# Patient Record
Sex: Female | Born: 1960
Health system: Southern US, Community
[De-identification: ages and names within clinical notes are randomized; demographics above are authoritative.]

## PROBLEM LIST (undated history)

## (undated) DIAGNOSIS — E039 Hypothyroidism, unspecified: Secondary | ICD-10-CM

## (undated) DIAGNOSIS — N63 Unspecified lump in unspecified breast: Secondary | ICD-10-CM

## (undated) DIAGNOSIS — C50411 Malignant neoplasm of upper-outer quadrant of right female breast: Secondary | ICD-10-CM

## (undated) DIAGNOSIS — F32A Depression, unspecified: Secondary | ICD-10-CM

## (undated) DIAGNOSIS — Z170 Estrogen receptor positive status [ER+]: Principal | ICD-10-CM

## (undated) DIAGNOSIS — F419 Anxiety disorder, unspecified: Secondary | ICD-10-CM

## (undated) DIAGNOSIS — F329 Major depressive disorder, single episode, unspecified: Secondary | ICD-10-CM

## (undated) HISTORY — DX: Malignant neoplasm of upper-outer quadrant of right female breast: C50.411

## (undated) HISTORY — DX: Estrogen receptor positive status (ER+): Z17.0

## (undated) HISTORY — DX: Anxiety disorder, unspecified: F41.9

## (undated) HISTORY — PX: BREAST SURGERY: SHX581

## (undated) HISTORY — DX: Depression, unspecified: F32.A

## (undated) HISTORY — PX: TONSILLECTOMY: SUR1361

## (undated) HISTORY — DX: Major depressive disorder, single episode, unspecified: F32.9

---

## 1999-04-24 ENCOUNTER — Other Ambulatory Visit: Admission: RE | Admit: 1999-04-24 | Discharge: 1999-04-24 | Payer: Self-pay | Admitting: *Deleted

## 2000-03-28 ENCOUNTER — Other Ambulatory Visit: Admission: RE | Admit: 2000-03-28 | Discharge: 2000-03-28 | Payer: Self-pay | Admitting: *Deleted

## 2002-02-22 ENCOUNTER — Other Ambulatory Visit: Admission: RE | Admit: 2002-02-22 | Discharge: 2002-02-22 | Payer: Self-pay | Admitting: Obstetrics and Gynecology

## 2003-03-19 ENCOUNTER — Other Ambulatory Visit: Admission: RE | Admit: 2003-03-19 | Discharge: 2003-03-19 | Payer: Self-pay | Admitting: Obstetrics and Gynecology

## 2003-07-18 ENCOUNTER — Ambulatory Visit (HOSPITAL_COMMUNITY): Admission: RE | Admit: 2003-07-18 | Discharge: 2003-07-18 | Payer: Self-pay | Admitting: Gastroenterology

## 2003-07-29 ENCOUNTER — Ambulatory Visit (HOSPITAL_COMMUNITY): Admission: RE | Admit: 2003-07-29 | Discharge: 2003-07-29 | Payer: Self-pay | Admitting: Gastroenterology

## 2010-10-12 ENCOUNTER — Other Ambulatory Visit (HOSPITAL_COMMUNITY)
Admission: RE | Admit: 2010-10-12 | Discharge: 2010-10-12 | Disposition: A | Discharge status: Home / Self Care | Payer: 59 | Source: Ambulatory Visit | Attending: Family Medicine | Admitting: Family Medicine

## 2010-10-12 ENCOUNTER — Other Ambulatory Visit: Payer: Self-pay | Admitting: Family Medicine

## 2010-10-12 DIAGNOSIS — Z124 Encounter for screening for malignant neoplasm of cervix: Secondary | ICD-10-CM | POA: Insufficient documentation

## 2011-10-20 ENCOUNTER — Other Ambulatory Visit (HOSPITAL_COMMUNITY)
Admission: RE | Admit: 2011-10-20 | Discharge: 2011-10-20 | Disposition: A | Discharge status: Home / Self Care | Payer: 59 | Source: Ambulatory Visit | Attending: Family Medicine | Admitting: Family Medicine

## 2011-10-20 ENCOUNTER — Other Ambulatory Visit: Payer: Self-pay | Admitting: Family Medicine

## 2011-10-20 DIAGNOSIS — Z124 Encounter for screening for malignant neoplasm of cervix: Secondary | ICD-10-CM | POA: Insufficient documentation

## 2012-09-28 ENCOUNTER — Other Ambulatory Visit (HOSPITAL_COMMUNITY)
Admission: RE | Admit: 2012-09-28 | Discharge: 2012-09-28 | Disposition: A | Discharge status: Home / Self Care | Payer: BC Managed Care – PPO | Source: Ambulatory Visit | Attending: Family Medicine | Admitting: Family Medicine

## 2012-09-28 ENCOUNTER — Other Ambulatory Visit: Payer: Self-pay | Admitting: Family Medicine

## 2012-09-28 DIAGNOSIS — Z124 Encounter for screening for malignant neoplasm of cervix: Secondary | ICD-10-CM | POA: Insufficient documentation

## 2014-12-05 ENCOUNTER — Other Ambulatory Visit: Payer: Self-pay

## 2014-12-05 DIAGNOSIS — Z1231 Encounter for screening mammogram for malignant neoplasm of breast: Secondary | ICD-10-CM

## 2015-01-08 ENCOUNTER — Ambulatory Visit
Admission: RE | Admit: 2015-01-08 | Discharge: 2015-01-08 | Disposition: A | Discharge status: Home / Self Care | Payer: BLUE CROSS/BLUE SHIELD | Source: Ambulatory Visit

## 2015-01-08 DIAGNOSIS — Z1231 Encounter for screening mammogram for malignant neoplasm of breast: Secondary | ICD-10-CM

## 2015-02-27 ENCOUNTER — Other Ambulatory Visit: Payer: Self-pay | Admitting: Family Medicine

## 2015-02-27 ENCOUNTER — Other Ambulatory Visit (HOSPITAL_COMMUNITY)
Admission: RE | Admit: 2015-02-27 | Discharge: 2015-02-27 | Disposition: A | Discharge status: Home / Self Care | Payer: BLUE CROSS/BLUE SHIELD | Source: Ambulatory Visit | Attending: Family Medicine | Admitting: Family Medicine

## 2015-02-27 DIAGNOSIS — Z01411 Encounter for gynecological examination (general) (routine) with abnormal findings: Secondary | ICD-10-CM | POA: Diagnosis not present

## 2015-03-04 LAB — CYTOLOGY - PAP

## 2015-10-29 DIAGNOSIS — F419 Anxiety disorder, unspecified: Secondary | ICD-10-CM | POA: Diagnosis not present

## 2015-10-29 DIAGNOSIS — E7800 Pure hypercholesterolemia, unspecified: Secondary | ICD-10-CM | POA: Diagnosis not present

## 2015-10-29 DIAGNOSIS — E039 Hypothyroidism, unspecified: Secondary | ICD-10-CM | POA: Diagnosis not present

## 2015-10-29 DIAGNOSIS — Z309 Encounter for contraceptive management, unspecified: Secondary | ICD-10-CM | POA: Diagnosis not present

## 2016-03-17 ENCOUNTER — Other Ambulatory Visit: Payer: Self-pay | Admitting: Family Medicine

## 2016-03-17 DIAGNOSIS — Z0000 Encounter for general adult medical examination without abnormal findings: Secondary | ICD-10-CM | POA: Diagnosis not present

## 2016-03-17 DIAGNOSIS — N6310 Unspecified lump in the right breast, unspecified quadrant: Secondary | ICD-10-CM

## 2016-03-17 DIAGNOSIS — E039 Hypothyroidism, unspecified: Secondary | ICD-10-CM | POA: Diagnosis not present

## 2016-03-17 DIAGNOSIS — N63 Unspecified lump in unspecified breast: Secondary | ICD-10-CM | POA: Diagnosis not present

## 2016-03-17 DIAGNOSIS — E7800 Pure hypercholesterolemia, unspecified: Secondary | ICD-10-CM | POA: Diagnosis not present

## 2016-03-19 ENCOUNTER — Ambulatory Visit
Admission: RE | Admit: 2016-03-19 | Discharge: 2016-03-19 | Disposition: A | Discharge status: Home / Self Care | Payer: BLUE CROSS/BLUE SHIELD | Source: Ambulatory Visit | Attending: Family Medicine | Admitting: Family Medicine

## 2016-03-19 ENCOUNTER — Other Ambulatory Visit: Payer: Self-pay | Admitting: Family Medicine

## 2016-03-19 DIAGNOSIS — N6310 Unspecified lump in the right breast, unspecified quadrant: Secondary | ICD-10-CM

## 2016-03-19 DIAGNOSIS — N63 Unspecified lump in unspecified breast: Secondary | ICD-10-CM

## 2016-03-19 DIAGNOSIS — C50411 Malignant neoplasm of upper-outer quadrant of right female breast: Secondary | ICD-10-CM | POA: Diagnosis not present

## 2016-03-19 DIAGNOSIS — N6311 Unspecified lump in the right breast, upper outer quadrant: Secondary | ICD-10-CM | POA: Diagnosis not present

## 2016-03-19 HISTORY — DX: Unspecified lump in unspecified breast: N63.0

## 2016-03-23 ENCOUNTER — Telehealth: Payer: Self-pay | Admitting: *Deleted

## 2016-03-23 NOTE — Telephone Encounter (Signed)
Left message for return phone call to schedule for [facility] 03/31/16.

## 2016-03-24 ENCOUNTER — Telehealth: Payer: Self-pay | Admitting: *Deleted

## 2016-03-24 ENCOUNTER — Encounter: Payer: Self-pay | Admitting: *Deleted

## 2016-03-24 DIAGNOSIS — Z170 Estrogen receptor positive status [ER+]: Secondary | ICD-10-CM

## 2016-03-24 DIAGNOSIS — C50411 Malignant neoplasm of upper-outer quadrant of right female breast: Secondary | ICD-10-CM

## 2016-03-24 HISTORY — DX: Estrogen receptor positive status (ER+): Z17.0

## 2016-03-24 HISTORY — DX: Malignant neoplasm of upper-outer quadrant of right female breast: C50.411

## 2016-03-24 NOTE — Telephone Encounter (Signed)
Confirmed [facility] for 03/31/16 at 0815.  Instructions and contact information given.

## 2016-03-29 DIAGNOSIS — Z0100 Encounter for examination of eyes and vision without abnormal findings: Secondary | ICD-10-CM | POA: Diagnosis not present

## 2016-03-31 ENCOUNTER — Ambulatory Visit (HOSPITAL_BASED_OUTPATIENT_CLINIC_OR_DEPARTMENT_OTHER): Payer: BLUE CROSS/BLUE SHIELD | Admitting: Hematology and Oncology

## 2016-03-31 ENCOUNTER — Ambulatory Visit
Admission: RE | Admit: 2016-03-31 | Discharge: 2016-03-31 | Disposition: A | Discharge status: Home / Self Care | Payer: BLUE CROSS/BLUE SHIELD | Source: Ambulatory Visit | Attending: Radiation Oncology | Admitting: Radiation Oncology

## 2016-03-31 ENCOUNTER — Encounter: Payer: Self-pay | Admitting: Hematology and Oncology

## 2016-03-31 ENCOUNTER — Ambulatory Visit: Payer: BLUE CROSS/BLUE SHIELD | Admitting: Physical Therapy

## 2016-03-31 ENCOUNTER — Other Ambulatory Visit (HOSPITAL_BASED_OUTPATIENT_CLINIC_OR_DEPARTMENT_OTHER): Payer: BLUE CROSS/BLUE SHIELD

## 2016-03-31 ENCOUNTER — Other Ambulatory Visit: Payer: Self-pay | Admitting: Surgery

## 2016-03-31 VITALS — BP 172/89 | HR 94 | Temp 98.4°F | Resp 18 | Ht 62.5 in | Wt 149.8 lb

## 2016-03-31 DIAGNOSIS — C50911 Malignant neoplasm of unspecified site of right female breast: Secondary | ICD-10-CM | POA: Diagnosis not present

## 2016-03-31 DIAGNOSIS — F419 Anxiety disorder, unspecified: Secondary | ICD-10-CM | POA: Diagnosis not present

## 2016-03-31 DIAGNOSIS — C50411 Malignant neoplasm of upper-outer quadrant of right female breast: Secondary | ICD-10-CM

## 2016-03-31 DIAGNOSIS — Z170 Estrogen receptor positive status [ER+]: Secondary | ICD-10-CM

## 2016-03-31 LAB — COMPREHENSIVE METABOLIC PANEL
ALT: 24 U/L (ref 0–55)
ANION GAP: 9 meq/L (ref 3–11)
AST: 17 U/L (ref 5–34)
Albumin: 3.6 g/dL (ref 3.5–5.0)
Alkaline Phosphatase: 95 U/L (ref 40–150)
BUN: 13.1 mg/dL (ref 7.0–26.0)
CHLORIDE: 106 meq/L (ref 98–109)
CO2: 23 meq/L (ref 22–29)
CREATININE: 0.8 mg/dL (ref 0.6–1.1)
Calcium: 9.4 mg/dL (ref 8.4–10.4)
EGFR: 84 mL/min/{1.73_m2} — ABNORMAL LOW (ref >90)
Glucose: 111 mg/dl (ref 70–140)
POTASSIUM: 3.8 meq/L (ref 3.5–5.1)
Sodium: 138 mEq/L (ref 136–145)
Total Bilirubin: 0.4 mg/dL (ref 0.20–1.20)
Total Protein: 7.9 g/dL (ref 6.4–8.3)

## 2016-03-31 LAB — CBC WITH DIFFERENTIAL/PLATELET
BASO%: 0.3 % (ref 0.0–2.0)
BASOS ABS: 0 10*3/uL (ref 0.0–0.1)
EOS%: 3.2 % (ref 0.0–7.0)
Eosinophils Absolute: 0.2 10*3/uL (ref 0.0–0.5)
HCT: 40 % (ref 34.8–46.6)
HGB: 12.6 g/dL (ref 11.6–15.9)
LYMPH%: 23.8 % (ref 14.0–49.7)
MCH: 26 pg (ref 25.1–34.0)
MCHC: 31.5 g/dL (ref 31.5–36.0)
MCV: 82.5 fL (ref 79.5–101.0)
MONO#: 0.4 10*3/uL (ref 0.1–0.9)
MONO%: 5 % (ref 0.0–14.0)
NEUT#: 4.7 10*3/uL (ref 1.5–6.5)
NEUT%: 67.7 % (ref 38.4–76.8)
PLATELETS: 354 10*3/uL (ref 145–400)
RBC: 4.85 10*6/uL (ref 3.70–5.45)
RDW: 14.2 % (ref 11.2–14.5)
WBC: 6.9 10*3/uL (ref 3.9–10.3)
lymph#: 1.7 10*3/uL (ref 0.9–3.3)

## 2016-03-31 MED ORDER — LIDOCAINE-PRILOCAINE 2.5-2.5 % EX CREA: TOPICAL_CREAM | CUTANEOUS | 3 refills | Status: DC

## 2016-03-31 MED ORDER — ONDANSETRON HCL 8 MG PO TABS: 8.0000 mg | ORAL_TABLET | Freq: Two times a day (BID) | ORAL | 1 refills | Status: DC | PRN

## 2016-03-31 MED ORDER — PROCHLORPERAZINE MALEATE 10 MG PO TABS: 10.0000 mg | ORAL_TABLET | Freq: Four times a day (QID) | ORAL | 1 refills | Status: DC | PRN

## 2016-03-31 MED ORDER — ALPRAZOLAM 0.5 MG PO TABS: 0.5000 mg | ORAL_TABLET | Freq: Three times a day (TID) | ORAL | 3 refills | Status: AC | PRN

## 2016-03-31 MED ORDER — DEXAMETHASONE 4 MG PO TABS: 4.0000 mg | ORAL_TABLET | Freq: Every day | ORAL | 1 refills | Status: DC

## 2016-03-31 NOTE — Progress Notes (Signed)
Radiation Oncology         [phone] ________________________________  Initial Outpatient Consultation  Name: Sara Hooper MRN: [id number]  Date: 03/31/2016  DOB: 12-10-1960  LZ:[id number],LPFXT RUTH, MD  Rozetta Nunnery, *   REFERRING PHYSICIAN: Alphonsa Overall  DIAGNOSIS: Clinical stage IA (cT1cN0) high grade invasive ductal carcinoma of the right breast (triple positive)  HISTORY OF PRESENT ILLNESS::Sara Hooper is a 56 y.o. female who self palpated a right breast mass in the 12:00 position confirmed by her primary care physician. Diagnostic bilateral mammogram on 03/19/16 showed an obscured irregular mass in the 12:00 position in the right breast middle to posterior depth with faint associated calcifications. On physical exam, an irregular mass in the superior right breast was palpated and measured approximately 3.5 cm. Ultrasound of the right breast showed a 2.0 x 1.9 x 3.0 cm mass in the 11:30 position 4 cm from the nipple. There was no right axillary adenopathy and the right breast was negative for disease. Biopsy of the 11:30 postion of the right breast on 03/19/16 revealed high grade IDC (ER 90% positive, PR 60% positive, HER2 positive, Ki67 70%). The patient presents today in multidisciplinary breast clinic to discuss treatment options for the management of her disease.  Gynecologic History  Age at first menstrual period? 16  Are you still having periods? Yes Approximate date of last period? 03/09/16  If you are still having periods: Are your periods regular? No  If you no longer have periods: Have you used hormone replacement? No Obstetric History:  How many children have you carried to term? N/A  Have you used birth control pills or hormone shots for contraception? Yes  If so, for how long (or approximate dates)? 10 years Health Maintenance:  Have you ever had a colonoscopy? No  Have you ever had a bone density? No  Date of your last PAP smear? 01/2015 Date of your FIRST  mammogram? 01/2015  PREVIOUS RADIATION THERAPY: No  PAST MEDICAL HISTORY:  has a past medical history of Breast mass (03/19/2016) and Malignant neoplasm of upper-outer quadrant of right breast in female, estrogen receptor positive ([place]) (03/24/2016).    PAST SURGICAL HISTORY:No past surgical history on file.  FAMILY HISTORY: family history is not on file.  SOCIAL HISTORY:    ALLERGIES: Patient has no allergy information on record.  MEDICATIONS:  No current outpatient prescriptions on file.   No current facility-administered medications for this encounter.     REVIEW OF SYSTEMS: On review of systems the patient reports a right breast lump, anxiety, and depression. The patient denies issues with her general constitution, eyes, ENT, heart, lungs, bowels, urine, skin, muscle or bones, nervous system, hormones, blood/lymph systems, or immune system. Pertinent items noted in HPI and remainder of comprehensive ROS otherwise negative.   PHYSICAL EXAM:  Vitals with BMI 03/31/2016  Height 5' 2.5"  Weight 149 lbs 13 oz  BMI 27  Systolic 024  Diastolic 89  Pulse 94  Respirations 18  General: Alert and oriented, in no acute distress HEENT: Head is normocephalic. Extraocular movements are intact. Oropharynx is clear. Neck: Neck is supple, no palpable cervical or supraclavicular lymphadenopathy. Heart: Regular in rate and rhythm with no murmurs, rubs, or gallops. Chest: Clear to auscultation bilaterally, with no rhonchi, wheezes, or rales. Extremities: No cyanosis or edema. Lymphatics: see Neck Exam. Axillary exam was negative. Skin: No concerning lesions. Musculoskeletal: symmetric strength and muscle tone throughout. Neurologic: Cranial nerves II through XII are grossly intact. No obvious focalities.  Speech is fluent. Coordination is intact. Psychiatric: Judgment and insight are intact. Affect is appropriate. Breast: Left breast no palpable mass or nipple discharge. In the right breast  approximately 11:00 position a 3.5 x 4.0 cm mass was appreciated, no nipple discharge or bleeding.  ECOG = 1   LABORATORY DATA:  No results found for: WBC, HGB, HCT, MCV, PLT, NEUTROABS No results found for: NA, K, CL, CO2, GLUCOSE, CREATININE, CALCIUM    RADIOGRAPHY: US Breast Ltd Uni Right Inc Axilla  Result Date: 03/19/2016 CLINICAL DATA:  Right breast 12 o'clock palpable mass felt clinically. EXAM: 2D DIGITAL DIAGNOSTIC BILATERAL MAMMOGRAM WITH CAD AND ADJUNCT TOMO ULTRASOUND RIGHT BREAST COMPARISON:  Previous exam(s). ACR Breast Density Category c: The breast tissue is heterogeneously dense, which may obscure small masses. FINDINGS: Mammographically, there are no suspicious masses, areas of architectural distortion or microcalcifications in the left breast. There is an ill-defined mostly obscured irregular mass in the right 12 o'clock breast, middle to posterior depth. Faint associated calcifications also seen. This finding corresponds to the area of palpable concern. Mammographic images were processed with CAD. On physical exam, I could palpate an irregular mass in the right superior breast which by palpation measures approximately 3.5 cm. Targeted ultrasound is performed, showing right breast 11:30 o'clock 4 cm from the nipple hypoechoic irregular mass which measures 2.0 x 1.9 x 3.0 cm. No evidence of right axillary lymphadenopathy. IMPRESSION: Suspicious palpable right breast 11:30 o'clock mass, for which ultrasound-guided core needle biopsy is recommended. No evidence of right axillary lymphadenopathy. RECOMMENDATION: Ultrasound-guided core needle biopsy of the right breast mass. I have discussed the findings and recommendations with the patient. Results were also provided in writing at the conclusion of the visit. If applicable, a reminder letter will be sent to the patient regarding the next appointment. BI-RADS CATEGORY  4: Suspicious. Electronically Signed   By: Fidela Salisbury M.D.    On: 03/19/2016 09:42   Mm Diag Breast Tomo Bilateral  Result Date: 03/19/2016 CLINICAL DATA:  Right breast 12 o'clock palpable mass felt clinically. EXAM: 2D DIGITAL DIAGNOSTIC BILATERAL MAMMOGRAM WITH CAD AND ADJUNCT TOMO ULTRASOUND RIGHT BREAST COMPARISON:  Previous exam(s). ACR Breast Density Category c: The breast tissue is heterogeneously dense, which may obscure small masses. FINDINGS: Mammographically, there are no suspicious masses, areas of architectural distortion or microcalcifications in the left breast. There is an ill-defined mostly obscured irregular mass in the right 12 o'clock breast, middle to posterior depth. Faint associated calcifications also seen. This finding corresponds to the area of palpable concern. Mammographic images were processed with CAD. On physical exam, I could palpate an irregular mass in the right superior breast which by palpation measures approximately 3.5 cm. Targeted ultrasound is performed, showing right breast 11:30 o'clock 4 cm from the nipple hypoechoic irregular mass which measures 2.0 x 1.9 x 3.0 cm. No evidence of right axillary lymphadenopathy. IMPRESSION: Suspicious palpable right breast 11:30 o'clock mass, for which ultrasound-guided core needle biopsy is recommended. No evidence of right axillary lymphadenopathy. RECOMMENDATION: Ultrasound-guided core needle biopsy of the right breast mass. I have discussed the findings and recommendations with the patient. Results were also provided in writing at the conclusion of the visit. If applicable, a reminder letter will be sent to the patient regarding the next appointment. BI-RADS CATEGORY  4: Suspicious. Electronically Signed   By: Fidela Salisbury M.D.   On: 03/19/2016 09:42   Mm Clip Placement Right  Result Date: 03/19/2016 CLINICAL DATA:  Post ultrasound-guided core needle biopsy  of right breast 12/1928 o'clock mass. EXAM: DIAGNOSTIC RIGHT MAMMOGRAM POST ULTRASOUND BIOPSY COMPARISON:  Previous exam(s).  FINDINGS: Mammographic images were obtained following ultrasound guided biopsy of right breast mass. Two-view mammography demonstrates presence of ribbon shaped marker within the biopsy site in appropriate radiographic position. IMPRESSION: Successful placement of ribbon shaped marker, post ultrasound-guided core needle biopsy of the right breast. Final Assessment: Post Procedure Mammograms for Marker Placement Electronically Signed   By: Fidela Salisbury M.D.   On: 03/19/2016 14:26   [place] Rt Breast Bx W Loc Dev 1st Lesion Img Bx Spec US Guide  Addendum Date: 03/23/2016   ADDENDUM REPORT: 03/23/2016 07:30 ADDENDUM: Pathology revealed high grade invasive ductal carcinoma in the right breast. This was found to be concordant by Dr. Fidela Salisbury. Pathology results were discussed with the patient by telephone. The patient reported doing well after the biopsy. Post biopsy instructions and care were reviewed and questions were answered. The patient was encouraged to call [facility] for any additional concerns. The patient was referred to the [facility] at the [facility] on March 31, 2016. Pathology results reported by Susa Raring RN, BSN on 03/23/2016. Electronically Signed   By: Fidela Salisbury M.D.   On: 03/23/2016 07:30   Result Date: 03/23/2016 CLINICAL DATA:  Right breast 11:30 o'clock suspicious mass. EXAM: ULTRASOUND GUIDED RIGHT BREAST CORE NEEDLE BIOPSY COMPARISON:  Previous exam(s). FINDINGS: I met with the patient and we discussed the procedure of ultrasound-guided biopsy, including benefits and alternatives. We discussed the high likelihood of a successful procedure. We discussed the risks of the procedure, including infection, bleeding, tissue injury, clip migration, and inadequate sampling. Informed written consent was given. The usual time-out protocol was performed immediately prior to the procedure. Using  sterile technique and 1% Lidocaine as local anesthetic, under direct ultrasound visualization, a 14 gauge spring-loaded device was used to perform biopsy of right breast mass using a medial approach. At the conclusion of the procedure a ribbon shaped tissue marker clip was deployed into the biopsy cavity. Follow up 2 view mammogram was performed and dictated separately. IMPRESSION: Ultrasound guided biopsy of right breast mass. No apparent complications. Electronically Signed: By: Fidela Salisbury M.D. On: 03/19/2016 14:22      IMPRESSION: Clinical stage IA (cT1cN0) high grade invasive ductal carcinoma of the right breast (triple positive)  The patient would be a good candidate for breast conservation therapy with anticipated tumor shrinkage from her neoadjuvant treatment. Radiation to the right breast would be given post-surgically to further reduce the risk of recurrence.  I spoke to the patient today regarding her diagnosis and options for treatment. We discussed the equivalence in terms of survival and local failure between mastectomy and breast conservation. We discussed the role of radiation in decreasing local failures in patients who undergo lumpectomy. We discussed the process of CT simulation and the placement tattoos. We discussed 4-6 weeks of treatment as an outpatient. We discussed the possibility of asymptomatic lung damage. We discussed the low likelihood of secondary malignancies. We discussed the possible side effects including but not limited to skin redness, fatigue, permanent skin darkening, and breast swelling.   PLAN: The patient will undergo a bilateral breast MRI to observe the extent of disease. She will then have an echocardiogram, chemo class, and placement of a port. The patient will have neoadjuvant chemotherapy to shrink the size of tumor. Surgery will be determined based on the results of neoadjuvant chemotherapy.  If there is good tumor shrinkage, then she would undergo a  right lumpectomy. The patient is interested in this surgical option if possible. Radiation would be given post-surgically and then the patient will be placed on an aromatase inhibitor. The patient will return to see me a few weeks after surgery to further discuss radiation depending on the final pathology results. ------------------------------------------------  Blair Promise, PhD, MD  This document serves as a record of services personally performed by Gery Pray, MD. It was created on his behalf by Darcus Austin, a trained medical scribe. The creation of this record is based on the scribe's personal observations and the provider's statements to them. This document has been checked and approved by the attending provider.

## 2016-03-31 NOTE — Progress Notes (Signed)
START ON PATHWAY REGIMEN - Breast   Docetaxel  + Carboplatin + Trastuzumab + Pertuzumab (TCHP) q21 Days:   A cycle is every 21 days:     Pertuzumab        Dose Mod: None     Pertuzumab        Dose Mod: None     Trastuzumab        Dose Mod: None     Trastuzumab        Dose Mod: None     Carboplatin        Dose Mod: None     Docetaxel        Dose Mod: None  **Always confirm dose/schedule in your pharmacy ordering system**    Trastuzumab (Maintenance - NO Loading Dose):   A cycle is every 21 days:     Trastuzumab        Dose Mod: None  **Always confirm dose/schedule in your pharmacy ordering system**    Patient Characteristics: Neoadjuvant Chemotherapy, HER2/neu Positive, ER Positive AJCC Stage Grouping: IIA Current Disease Status: No Distant Mets or Local Recurrence AJCC M Stage: 0 ER Status: Positive (+) AJCC N Stage: 0 AJCC T Stage: 2 HER2/neu: Positive (+) PR Status: Positive (+)  Intent of Therapy: Curative Intent, Discussed with Patient

## 2016-03-31 NOTE — Progress Notes (Signed)
[place] NOTE  Patient Care Team: Darcus Austin, MD as PCP - General (Family Medicine) Alphonsa Overall, MD as Consulting Physician (General Surgery) Nicholas Lose, MD as Consulting Physician (Hematology and Oncology) Eppie Gibson, MD as Attending Physician (Radiation Oncology)  CHIEF COMPLAINTS/PURPOSE OF CONSULTATION:  Newly diagnosed breast cancer  HISTORY OF PRESENTING ILLNESS:  Sara Hooper 56 y.o. female is here because of recent diagnosis of right breast cancer. Patient's primary care physician palpated her breast and felt a lump. This led to investigation with a mammogram and ultrasound. She was found to have a 3 cm mass in the right breast. This was evaluated and biopsy. Pathology came back as invasive ductal carcinoma high-grade that was ER/PR and HER-2 positive with a Ki-67 of 70%. She was presented this morning at the multidisciplinary tumor board and she is here today to discuss the treatment plan. She is accompanied by her husband. She was extremely anxious.  I reviewed her records extensively and collaborated the history with the patient.  SUMMARY OF ONCOLOGIC HISTORY:   Malignant neoplasm of upper-outer quadrant of right breast in female, estrogen receptor positive ([place])   03/19/2016 Initial Diagnosis    Right breast biopsy 11:30 position: IDC high-grade, ER 90%, PR 60%, HER-2 positive ratio 1.75 average copy #9.9, Ki-67 70%; right breast lump felt by PCP upper central 11:30 position 3 cm axilla negative, T2 N0 stage II a clinical stage      MEDICAL HISTORY:  Past Medical History:  Diagnosis Date  . Anxiety   . Breast mass 03/19/2016   right breast mass 1200  . Depression   . Malignant neoplasm of upper-outer quadrant of right breast in female, estrogen receptor positive ([place]) 03/24/2016    SURGICAL HISTORY: History reviewed. No pertinent surgical history.  SOCIAL HISTORY: Social History   Social History  . Marital status: Single    Spouse  name: N/A  . Number of children: N/A  . Years of education: N/A   Occupational History  . Not on file.   Social History Main Topics  . Smoking status: Never Smoker  . Smokeless tobacco: Not on file  . Alcohol use No  . Drug use: No  . Sexual activity: Not on file   Other Topics Concern  . Not on file   Social History Narrative  . No narrative on file    FAMILY HISTORY: Family History  Problem Relation Age of Onset  . Colon cancer Mother     ALLERGIES:  has No Known Allergies.  MEDICATIONS:  Current Outpatient Prescriptions  Medication Sig Dispense Refill  . Celecoxib (CELEBREX PO) Take 1 tablet by mouth daily.    . Levothyroxine Sodium (SYNTHROID PO) Take 1 tablet by mouth daily.    . norethindrone-ethinyl estradiol (MICROGESTIN,JUNEL,LOESTRIN) 1-20 MG-MCG tablet Take 1 tablet by mouth daily.    Marland Kitchen ALPRAZolam (XANAX) 0.5 MG tablet Take 1 tablet (0.5 mg total) by mouth 3 (three) times daily as needed for anxiety. 90 tablet 3   No current facility-administered medications for this visit.     REVIEW OF SYSTEMS:   Constitutional: Denies fevers, chills or abnormal night sweats Eyes: Denies blurriness of vision, double vision or watery eyes Ears, nose, mouth, throat, and face: Denies mucositis or sore throat Respiratory: Denies cough, dyspnea or wheezes Cardiovascular: Denies palpitation, chest discomfort or lower extremity swelling Gastrointestinal:  Denies nausea, heartburn or change in bowel habits Skin: Denies abnormal skin rashes Lymphatics: Denies new lymphadenopathy or easy bruising Neurological:Denies numbness, tingling  or new weaknesses Behavioral/Psych: Severe anxiety Breast: Palpable lump in the breast All other systems were reviewed with the patient and are negative.  PHYSICAL EXAMINATION: ECOG PERFORMANCE STATUS: 1 - Symptomatic but completely ambulatory  Vitals:   03/31/16 0907  BP: (!) 172/89  Pulse: 94  Resp: 18  Temp: 98.4 F (36.9 C)   Filed  Weights   03/31/16 0907  Weight: 149 lb 12.8 oz (67.9 kg)    GENERAL:alert, no distress and comfortable SKIN: skin color, texture, turgor are normal, no rashes or significant lesions EYES: normal, conjunctiva are pink and non-injected, sclera clear OROPHARYNX:no exudate, no erythema and lips, buccal mucosa, and tongue normal  NECK: supple, thyroid normal size, non-tender, without nodularity LYMPH:  no palpable lymphadenopathy in the cervical, axillary or inguinal LUNGS: clear to auscultation and percussion with normal breathing effort HEART: regular rate & rhythm and no murmurs and no lower extremity edema ABDOMEN:abdomen soft, non-tender and normal bowel sounds Musculoskeletal:no cyanosis of digits and no clubbing  PSYCH: alert & oriented x 3 with fluent speech NEURO: no focal motor/sensory deficits  LABORATORY DATA:  I have reviewed the data as listed Lab Results  Component Value Date   WBC 6.9 03/31/2016   HGB 12.6 03/31/2016   HCT 40.0 03/31/2016   MCV 82.5 03/31/2016   PLT 354 03/31/2016   Lab Results  Component Value Date   NA 138 03/31/2016   K 3.8 03/31/2016   CO2 23 03/31/2016    RADIOGRAPHIC STUDIES: I have personally reviewed the radiological reports and agreed with the findings in the report.  ASSESSMENT AND PLAN:  Malignant neoplasm of upper-outer quadrant of right breast in female, estrogen receptor positive ([place]) Right breast biopsy 11:30 position 03/19/2016: IDC high-grade, ER 90%, PR 60%, HER-2 positive ratio 1.75 average copy #9.9, Ki-67 70%; right breast lump felt by PCP upper central 11:30 position 3 cm axilla negative, T2 N0 stage II a clinical stage  Pathology and radiology counseling: Discussed with the patient, the details of pathology including the type of breast cancer,the clinical staging, the significance of ER, PR and HER-2/neu receptors and the implications for treatment. After reviewing the pathology in detail, we proceeded to discuss the  different treatment options between surgery, radiation, chemotherapy, antiestrogen therapies.  Recommendation based on multidisciplinary tumor board: 1. Neoadjuvant chemotherapy with TCH Perjeta 6 cycles followed by Herceptin and Perjeta maintenance for 1 year 2. Followed by breast conserving surgery if possible with sentinel lymph node study 3. Followed by adjuvant radiation therapy 4. Followed by antiestrogen therapy with letrozole 5-7 years   Chemotherapy Counseling: I discussed the risks and benefits of chemotherapy including the risks of nausea/ vomiting, risk of infection from low WBC count, fatigue due to chemo or anemia, bruising or bleeding due to low platelets, mouth sores, loss/ change in taste and decreased appetite. Liver and kidney function will be monitored through out chemotherapy as abnormalities in liver and kidney function may be a side effect of treatment. Cardiac dysfunction due to Herceptin and Perjeta were discussed in detail. Risk of permanent bone marrow dysfunction due to chemo were also discussed.  Plan: 1. Port placement 2. Echocardiogram 3. Chemotherapy class 4. Breast MRI  Severe anxiety: I gave a prescription for Xanax 0.5 mg by mouth 3 times a day when necessary.  Our goal is to manage patient's toxicities and simultaneously cure her breast cancer.  Patient's husband has been extremely supportive.   Return to clinic in 2 weeks to start chemotherapy.   All  questions were answered. The patient knows to call the clinic with any problems, questions or concerns.    Rulon Eisenmenger, MD 03/31/16

## 2016-03-31 NOTE — Assessment & Plan Note (Signed)
Right breast biopsy 11:30 position 03/19/2016: IDC high-grade, ER 90%, PR 60%, HER-2 positive ratio 1.75 average copy #9.9, Ki-67 70%; right breast lump felt by PCP upper central 11:30 position 3 cm axilla negative, T2 N0 stage II a clinical stage  Pathology and radiology counseling: Discussed with the patient, the details of pathology including the type of breast cancer,the clinical staging, the significance of ER, PR and HER-2/neu receptors and the implications for treatment. After reviewing the pathology in detail, we proceeded to discuss the different treatment options between surgery, radiation, chemotherapy, antiestrogen therapies.  Recommendation based on multidisciplinary tumor board: 1. Neoadjuvant chemotherapy with TCH Perjeta 6 cycles followed by Herceptin and Perjeta maintenance for 1 year 2. Followed by breast conserving surgery if possible with sentinel lymph node study 3. Followed by adjuvant radiation therapy 4. Followed by antiestrogen therapy with letrozole 5-7 years   Chemotherapy Counseling: I discussed the risks and benefits of chemotherapy including the risks of nausea/ vomiting, risk of infection from low WBC count, fatigue due to chemo or anemia, bruising or bleeding due to low platelets, mouth sores, loss/ change in taste and decreased appetite. Liver and kidney function will be monitored through out chemotherapy as abnormalities in liver and kidney function may be a side effect of treatment. Cardiac dysfunction due to Herceptin and Perjeta were discussed in detail. Risk of permanent bone marrow dysfunction due to chemo were also discussed.  Plan: 1. Port placement 2. Echocardiogram 3. Chemotherapy class 4. Breast MRI  Severe anxiety: I gave a prescription for Xanax 0.5 mg by mouth 3 times a day when necessary.  Our goal is to manage patient's toxicities and simultaneously cure her breast cancer.  Patient's husband has been extremely supportive.   Return to clinic in  2 weeks to start chemotherapy.

## 2016-03-31 NOTE — Progress Notes (Signed)
Nutrition Assessment  Reason for Assessment:  Pt seen in Breast Clinic  ASSESSMENT:   56 year old female with new diagnosis of right breast cancer.  Past medical history of anxiety.   Medications:  reviewed  Labs: reviewed  Anthropometrics:   Height: 62.5 inches Weight: 149 pounds BMI: 27   NUTRITION DIAGNOSIS: Food and nutrition related knowledge deficit related to new diagnosis of breast cancer as evidenced by no prior need for nutrition related information.  INTERVENTION:    Discussed and provided packet of information regarding nutritional tips for breast cancer patients.  Questions answered.  Teachback method used.  Contact information provided and patient knows to contact me with questions/concerns.   MONITORING, EVALUATION, and GOAL: Pt will consume a healthy plant based diet to maintain lean body mass throughout treatment.   Sara Hooper B. Zenia Resides, [place], [place] Registered Dietitian [phone] (pager)

## 2016-04-01 ENCOUNTER — Telehealth (HOSPITAL_COMMUNITY): Payer: Self-pay | Admitting: Vascular Surgery

## 2016-04-01 ENCOUNTER — Encounter: Payer: Self-pay | Admitting: General Practice

## 2016-04-01 NOTE — Telephone Encounter (Signed)
Eft pt message to make NP brst / echo

## 2016-04-01 NOTE — Progress Notes (Signed)
[place] Psychosocial Distress Screening Spiritual Care  Reached Sara Hooper by phone following [facility] to introduce [place] team/resources, reviewing distress screen per protocol.  The patient scored a 9 on the Psychosocial Distress Thermometer which indicates severe distress. Also assessed for distress and other psychosocial needs.   ONCBCN DISTRESS SCREENING 04/01/2016  Screening Type Initial Screening  Distress experienced in past week (1-10) 9  Emotional problem type Nervousness/Anxiety;Adjusting to illness  Spiritual/Religous concerns type Facing my mortality  Information Concerns Type Lack of info about diagnosis;Lack of info about treatment  Physical Problem type Sleep/insomnia;Loss of appetitie  Referral to support programs Yes    Pt was very pleased at receiving f/u call and reports decreased distress, thanks to thoroughness of [facility] and having a plan now.  Per pt, facing port and chemo are most distressing details at this time.  She also states that her new xanax rx is helping her manage stress well.  Follow up needed: Yes.  Pt is aware of ongoing [facility] team/resource availability.  Plan to connect while pt is on campus, but please also page if immediate needs arise.  Thank you.   [place], [place], [facility] Pager [phone] Voicemail [phone]

## 2016-04-02 ENCOUNTER — Telehealth: Payer: Self-pay | Admitting: *Deleted

## 2016-04-02 DIAGNOSIS — C50911 Malignant neoplasm of unspecified site of right female breast: Secondary | ICD-10-CM | POA: Diagnosis not present

## 2016-04-02 NOTE — Telephone Encounter (Signed)
  Oncology Nurse Navigator Documentation  Navigator Location: [facility] (04/02/16 0900)   )Navigator Encounter Type: Telephone (04/02/16 0900) Telephone: Lahoma Crocker Call (04/02/16 0900)                                                  Time Spent with Patient: 15 (04/02/16 0900)

## 2016-04-05 ENCOUNTER — Encounter (HOSPITAL_COMMUNITY): Payer: Self-pay | Admitting: Radiology

## 2016-04-05 ENCOUNTER — Ambulatory Visit (HOSPITAL_COMMUNITY)
Admission: RE | Admit: 2016-04-05 | Discharge: 2016-04-05 | Disposition: A | Discharge status: Home / Self Care | Payer: BLUE CROSS/BLUE SHIELD | Source: Ambulatory Visit | Attending: Hematology and Oncology | Admitting: Hematology and Oncology

## 2016-04-05 DIAGNOSIS — C50411 Malignant neoplasm of upper-outer quadrant of right female breast: Secondary | ICD-10-CM | POA: Insufficient documentation

## 2016-04-05 DIAGNOSIS — Z170 Estrogen receptor positive status [ER+]: Secondary | ICD-10-CM

## 2016-04-05 DIAGNOSIS — C50911 Malignant neoplasm of unspecified site of right female breast: Secondary | ICD-10-CM | POA: Diagnosis not present

## 2016-04-05 DIAGNOSIS — R112 Nausea with vomiting, unspecified: Secondary | ICD-10-CM | POA: Diagnosis not present

## 2016-04-05 MED ORDER — GADOBENATE DIMEGLUMINE 529 MG/ML IV SOLN
15.0000 mL | Freq: Once | INTRAVENOUS | Status: AC | PRN
  Administered 2016-04-05: 14 mL via INTRAVENOUS

## 2016-04-06 ENCOUNTER — Encounter (HOSPITAL_COMMUNITY): Payer: Self-pay | Admitting: *Deleted

## 2016-04-07 ENCOUNTER — Other Ambulatory Visit: Payer: Self-pay | Admitting: Hematology and Oncology

## 2016-04-07 ENCOUNTER — Telehealth: Payer: Self-pay | Admitting: *Deleted

## 2016-04-07 ENCOUNTER — Other Ambulatory Visit: Payer: Self-pay | Admitting: *Deleted

## 2016-04-07 DIAGNOSIS — Z170 Estrogen receptor positive status [ER+]: Secondary | ICD-10-CM

## 2016-04-07 DIAGNOSIS — C50411 Malignant neoplasm of upper-outer quadrant of right female breast: Secondary | ICD-10-CM

## 2016-04-07 DIAGNOSIS — N6310 Unspecified lump in the right breast, unspecified quadrant: Secondary | ICD-10-CM

## 2016-04-07 DIAGNOSIS — R928 Other abnormal and inconclusive findings on diagnostic imaging of breast: Secondary | ICD-10-CM

## 2016-04-07 NOTE — Telephone Encounter (Signed)
Spoke with patient regarding her MRI breast.  Patient needs additional biopsies.  Scheduled MRI bx for 3/16 at 7am and U/S and bx for 3/12 at 910am.  Patient is going for second opinion at [facility] 04/08/16 and will let us know what she decides to do.

## 2016-04-08 ENCOUNTER — Other Ambulatory Visit: Payer: BLUE CROSS/BLUE SHIELD

## 2016-04-08 DIAGNOSIS — C50911 Malignant neoplasm of unspecified site of right female breast: Secondary | ICD-10-CM | POA: Diagnosis not present

## 2016-04-09 ENCOUNTER — Ambulatory Visit (HOSPITAL_COMMUNITY)
Admission: RE | Admit: 2016-04-09 | Discharge: 2016-04-09 | Disposition: A | Discharge status: Home / Self Care | Payer: BLUE CROSS/BLUE SHIELD | Source: Ambulatory Visit | Attending: Hematology and Oncology | Admitting: Hematology and Oncology

## 2016-04-09 ENCOUNTER — Telehealth: Payer: Self-pay | Admitting: *Deleted

## 2016-04-09 ENCOUNTER — Other Ambulatory Visit: Payer: BLUE CROSS/BLUE SHIELD

## 2016-04-09 DIAGNOSIS — Z170 Estrogen receptor positive status [ER+]: Secondary | ICD-10-CM | POA: Insufficient documentation

## 2016-04-09 DIAGNOSIS — C50411 Malignant neoplasm of upper-outer quadrant of right female breast: Secondary | ICD-10-CM | POA: Diagnosis not present

## 2016-04-09 NOTE — Progress Notes (Signed)
  Echocardiogram 2D Echocardiogram has been performed.  Sara Hooper 04/09/2016, 11:58 AM

## 2016-04-12 ENCOUNTER — Encounter: Payer: Self-pay | Admitting: Hematology and Oncology

## 2016-04-12 ENCOUNTER — Ambulatory Visit
Admission: RE | Admit: 2016-04-12 | Discharge: 2016-04-12 | Disposition: A | Discharge status: Home / Self Care | Payer: BLUE CROSS/BLUE SHIELD | Source: Ambulatory Visit | Attending: Hematology and Oncology | Admitting: Hematology and Oncology

## 2016-04-12 DIAGNOSIS — R928 Other abnormal and inconclusive findings on diagnostic imaging of breast: Secondary | ICD-10-CM

## 2016-04-12 DIAGNOSIS — N6489 Other specified disorders of breast: Secondary | ICD-10-CM | POA: Diagnosis not present

## 2016-04-12 NOTE — Progress Notes (Signed)
Called pt to introduce myself as her FA and to discuss copay assistance.  Requested she return my call instead I received a call from her fiance' [place] me that she's going to be receiving treatment at [facility] and that he told Varney Biles this.  I called Varney Biles and left a message on her vm relaying what the fiance' said.  Requested that her appointments be removed.

## 2016-04-13 ENCOUNTER — Telehealth: Payer: Self-pay | Admitting: *Deleted

## 2016-04-13 ENCOUNTER — Ambulatory Visit (HOSPITAL_COMMUNITY): Admission: RE | Admit: 2016-04-13 | Payer: BLUE CROSS/BLUE SHIELD | Source: Ambulatory Visit | Admitting: Surgery

## 2016-04-13 ENCOUNTER — Other Ambulatory Visit: Payer: Self-pay | Admitting: Hematology and Oncology

## 2016-04-13 HISTORY — DX: Hypothyroidism, unspecified: E03.9

## 2016-04-13 SURGERY — INSERTION, TUNNELED CENTRAL VENOUS DEVICE, WITH PORT
Anesthesia: General

## 2016-04-13 NOTE — Telephone Encounter (Signed)
Spoke with patient to confirm that she is going to be treated at [facility].  She states that she wants to take advantage of the [facility].  She is still undecided where she will have surgery here or at [facility].  Informed her to let [facility] know when she decides.  Encouraged to call with any needs or concerns.

## 2016-04-14 DIAGNOSIS — Z79899 Other long term (current) drug therapy: Secondary | ICD-10-CM | POA: Diagnosis not present

## 2016-04-14 DIAGNOSIS — C50911 Malignant neoplasm of unspecified site of right female breast: Secondary | ICD-10-CM | POA: Diagnosis not present

## 2016-04-15 ENCOUNTER — Ambulatory Visit: Payer: BLUE CROSS/BLUE SHIELD

## 2016-04-15 ENCOUNTER — Other Ambulatory Visit: Payer: BLUE CROSS/BLUE SHIELD

## 2016-04-15 ENCOUNTER — Encounter: Payer: Self-pay | Admitting: General Practice

## 2016-04-15 ENCOUNTER — Ambulatory Visit: Payer: BLUE CROSS/BLUE SHIELD | Admitting: Hematology and Oncology

## 2016-04-15 NOTE — Progress Notes (Signed)
[place] Spiritual Care Note  Planned to f/u with Jordy in infusion today per previous schedule, then noted her change in care plan.  Spiritual Care remains available for pt support as desired.  Please page if needs arise.  Thank you.   [place], [place], [facility] Pager [phone] Voicemail [phone]

## 2016-04-16 ENCOUNTER — Ambulatory Visit
Admission: RE | Admit: 2016-04-16 | Discharge: 2016-04-16 | Disposition: A | Discharge status: Home / Self Care | Payer: BLUE CROSS/BLUE SHIELD | Source: Ambulatory Visit | Attending: Hematology and Oncology | Admitting: Hematology and Oncology

## 2016-04-16 DIAGNOSIS — N6310 Unspecified lump in the right breast, unspecified quadrant: Secondary | ICD-10-CM

## 2016-04-16 DIAGNOSIS — C50411 Malignant neoplasm of upper-outer quadrant of right female breast: Secondary | ICD-10-CM

## 2016-04-16 DIAGNOSIS — N6489 Other specified disorders of breast: Secondary | ICD-10-CM | POA: Diagnosis not present

## 2016-04-16 DIAGNOSIS — Z170 Estrogen receptor positive status [ER+]: Secondary | ICD-10-CM

## 2016-04-16 DIAGNOSIS — N6011 Diffuse cystic mastopathy of right breast: Secondary | ICD-10-CM | POA: Diagnosis not present

## 2016-04-16 MED ORDER — GADOBENATE DIMEGLUMINE 529 MG/ML IV SOLN
13.0000 mL | Freq: Once | INTRAVENOUS | Status: AC | PRN
  Administered 2016-04-16: 13 mL via INTRAVENOUS

## 2016-04-22 DIAGNOSIS — Z801 Family history of malignant neoplasm of trachea, bronchus and lung: Secondary | ICD-10-CM | POA: Diagnosis not present

## 2016-04-22 DIAGNOSIS — F419 Anxiety disorder, unspecified: Secondary | ICD-10-CM | POA: Diagnosis not present

## 2016-04-22 DIAGNOSIS — R7989 Other specified abnormal findings of blood chemistry: Secondary | ICD-10-CM | POA: Diagnosis not present

## 2016-04-22 DIAGNOSIS — T451X5A Adverse effect of antineoplastic and immunosuppressive drugs, initial encounter: Secondary | ICD-10-CM | POA: Diagnosis not present

## 2016-04-22 DIAGNOSIS — Z79899 Other long term (current) drug therapy: Secondary | ICD-10-CM | POA: Diagnosis not present

## 2016-04-22 DIAGNOSIS — Z5111 Encounter for antineoplastic chemotherapy: Secondary | ICD-10-CM | POA: Diagnosis not present

## 2016-04-22 DIAGNOSIS — C50911 Malignant neoplasm of unspecified site of right female breast: Secondary | ICD-10-CM | POA: Diagnosis not present

## 2016-04-22 DIAGNOSIS — L658 Other specified nonscarring hair loss: Secondary | ICD-10-CM | POA: Diagnosis not present

## 2016-04-22 DIAGNOSIS — Z800 Family history of malignant neoplasm of digestive organs: Secondary | ICD-10-CM | POA: Diagnosis not present

## 2016-04-22 DIAGNOSIS — Z170 Estrogen receptor positive status [ER+]: Secondary | ICD-10-CM | POA: Diagnosis not present

## 2016-04-23 ENCOUNTER — Ambulatory Visit: Payer: BLUE CROSS/BLUE SHIELD | Admitting: Adult Health

## 2016-04-23 ENCOUNTER — Other Ambulatory Visit: Payer: BLUE CROSS/BLUE SHIELD

## 2016-04-26 ENCOUNTER — Telehealth: Payer: Self-pay | Admitting: Physical Therapy

## 2016-04-26 NOTE — Telephone Encounter (Signed)
Sara Hooper declined a baseline PT eval at breast clinic on 03/31/16 due to fatigue. She requested I follow up with her in a few weeks. Spoke with Sara Hooper today to see if she wanted to have a baseline PT assessment even though she is getting care at [facility]. She said she has started chemo there and is scheduled to meet with their surgeon in a few weeks, but may still have Dr. Alphonsa Overall do her surgery here at [facility]. She requested I call her back at the end of April to see if she has made a decision. If she has surgery at [facility], she would like to have a PT assessment before surgery. I will call her back around 05/31/16.

## 2016-04-29 DIAGNOSIS — R110 Nausea: Secondary | ICD-10-CM | POA: Diagnosis not present

## 2016-04-29 DIAGNOSIS — Z801 Family history of malignant neoplasm of trachea, bronchus and lung: Secondary | ICD-10-CM | POA: Diagnosis not present

## 2016-04-29 DIAGNOSIS — T451X5A Adverse effect of antineoplastic and immunosuppressive drugs, initial encounter: Secondary | ICD-10-CM | POA: Diagnosis not present

## 2016-04-29 DIAGNOSIS — Z170 Estrogen receptor positive status [ER+]: Secondary | ICD-10-CM | POA: Diagnosis not present

## 2016-04-29 DIAGNOSIS — Z79899 Other long term (current) drug therapy: Secondary | ICD-10-CM | POA: Diagnosis not present

## 2016-04-29 DIAGNOSIS — G893 Neoplasm related pain (acute) (chronic): Secondary | ICD-10-CM | POA: Diagnosis not present

## 2016-04-29 DIAGNOSIS — R7989 Other specified abnormal findings of blood chemistry: Secondary | ICD-10-CM | POA: Diagnosis not present

## 2016-04-29 DIAGNOSIS — Z800 Family history of malignant neoplasm of digestive organs: Secondary | ICD-10-CM | POA: Diagnosis not present

## 2016-04-29 DIAGNOSIS — C50911 Malignant neoplasm of unspecified site of right female breast: Secondary | ICD-10-CM | POA: Diagnosis not present

## 2016-04-29 DIAGNOSIS — F419 Anxiety disorder, unspecified: Secondary | ICD-10-CM | POA: Diagnosis not present

## 2016-05-06 ENCOUNTER — Ambulatory Visit: Payer: BLUE CROSS/BLUE SHIELD

## 2016-05-06 ENCOUNTER — Other Ambulatory Visit: Payer: BLUE CROSS/BLUE SHIELD

## 2016-05-13 DIAGNOSIS — R7989 Other specified abnormal findings of blood chemistry: Secondary | ICD-10-CM | POA: Diagnosis not present

## 2016-05-13 DIAGNOSIS — C50911 Malignant neoplasm of unspecified site of right female breast: Secondary | ICD-10-CM | POA: Diagnosis not present

## 2016-05-13 DIAGNOSIS — Z170 Estrogen receptor positive status [ER+]: Secondary | ICD-10-CM | POA: Diagnosis not present

## 2016-05-13 DIAGNOSIS — Z800 Family history of malignant neoplasm of digestive organs: Secondary | ICD-10-CM | POA: Diagnosis not present

## 2016-05-13 DIAGNOSIS — Z801 Family history of malignant neoplasm of trachea, bronchus and lung: Secondary | ICD-10-CM | POA: Diagnosis not present

## 2016-05-13 DIAGNOSIS — R944 Abnormal results of kidney function studies: Secondary | ICD-10-CM | POA: Diagnosis not present

## 2016-05-13 DIAGNOSIS — Z5111 Encounter for antineoplastic chemotherapy: Secondary | ICD-10-CM | POA: Diagnosis not present

## 2016-05-13 DIAGNOSIS — F419 Anxiety disorder, unspecified: Secondary | ICD-10-CM | POA: Diagnosis not present

## 2016-05-13 DIAGNOSIS — Z79899 Other long term (current) drug therapy: Secondary | ICD-10-CM | POA: Diagnosis not present

## 2016-05-27 ENCOUNTER — Other Ambulatory Visit: Payer: BLUE CROSS/BLUE SHIELD

## 2016-05-27 ENCOUNTER — Ambulatory Visit: Payer: BLUE CROSS/BLUE SHIELD

## 2016-06-03 DIAGNOSIS — Z79899 Other long term (current) drug therapy: Secondary | ICD-10-CM | POA: Diagnosis not present

## 2016-06-03 DIAGNOSIS — R7989 Other specified abnormal findings of blood chemistry: Secondary | ICD-10-CM | POA: Diagnosis not present

## 2016-06-03 DIAGNOSIS — Z170 Estrogen receptor positive status [ER+]: Secondary | ICD-10-CM | POA: Diagnosis not present

## 2016-06-03 DIAGNOSIS — Z800 Family history of malignant neoplasm of digestive organs: Secondary | ICD-10-CM | POA: Diagnosis not present

## 2016-06-03 DIAGNOSIS — F419 Anxiety disorder, unspecified: Secondary | ICD-10-CM | POA: Diagnosis not present

## 2016-06-03 DIAGNOSIS — Z801 Family history of malignant neoplasm of trachea, bronchus and lung: Secondary | ICD-10-CM | POA: Diagnosis not present

## 2016-06-03 DIAGNOSIS — C50911 Malignant neoplasm of unspecified site of right female breast: Secondary | ICD-10-CM | POA: Diagnosis not present

## 2016-06-03 DIAGNOSIS — Z09 Encounter for follow-up examination after completed treatment for conditions other than malignant neoplasm: Secondary | ICD-10-CM | POA: Diagnosis not present

## 2016-06-11 DIAGNOSIS — C50911 Malignant neoplasm of unspecified site of right female breast: Secondary | ICD-10-CM | POA: Diagnosis not present

## 2016-06-15 DIAGNOSIS — C50911 Malignant neoplasm of unspecified site of right female breast: Secondary | ICD-10-CM | POA: Diagnosis not present

## 2016-06-15 DIAGNOSIS — C50811 Malignant neoplasm of overlapping sites of right female breast: Secondary | ICD-10-CM | POA: Diagnosis not present

## 2016-06-17 DIAGNOSIS — C50911 Malignant neoplasm of unspecified site of right female breast: Secondary | ICD-10-CM | POA: Diagnosis not present

## 2016-06-17 DIAGNOSIS — R7989 Other specified abnormal findings of blood chemistry: Secondary | ICD-10-CM | POA: Diagnosis not present

## 2016-06-18 ENCOUNTER — Other Ambulatory Visit: Payer: Self-pay | Admitting: Hematology and Oncology

## 2016-06-18 DIAGNOSIS — C50411 Malignant neoplasm of upper-outer quadrant of right female breast: Secondary | ICD-10-CM

## 2016-06-18 DIAGNOSIS — Z170 Estrogen receptor positive status [ER+]: Secondary | ICD-10-CM

## 2016-06-24 DIAGNOSIS — Z803 Family history of malignant neoplasm of breast: Secondary | ICD-10-CM | POA: Diagnosis not present

## 2016-06-24 DIAGNOSIS — Z6822 Body mass index (BMI) 22.0-22.9, adult: Secondary | ICD-10-CM | POA: Diagnosis not present

## 2016-06-24 DIAGNOSIS — E876 Hypokalemia: Secondary | ICD-10-CM | POA: Diagnosis not present

## 2016-06-24 DIAGNOSIS — Z800 Family history of malignant neoplasm of digestive organs: Secondary | ICD-10-CM | POA: Diagnosis not present

## 2016-06-24 DIAGNOSIS — F419 Anxiety disorder, unspecified: Secondary | ICD-10-CM | POA: Diagnosis not present

## 2016-06-24 DIAGNOSIS — Z5111 Encounter for antineoplastic chemotherapy: Secondary | ICD-10-CM | POA: Diagnosis not present

## 2016-06-24 DIAGNOSIS — R112 Nausea with vomiting, unspecified: Secondary | ICD-10-CM | POA: Diagnosis not present

## 2016-06-24 DIAGNOSIS — Z79899 Other long term (current) drug therapy: Secondary | ICD-10-CM | POA: Diagnosis not present

## 2016-06-24 DIAGNOSIS — C50911 Malignant neoplasm of unspecified site of right female breast: Secondary | ICD-10-CM | POA: Diagnosis not present

## 2016-06-24 DIAGNOSIS — R7989 Other specified abnormal findings of blood chemistry: Secondary | ICD-10-CM | POA: Diagnosis not present

## 2016-06-24 DIAGNOSIS — Z170 Estrogen receptor positive status [ER+]: Secondary | ICD-10-CM | POA: Diagnosis not present

## 2016-06-24 DIAGNOSIS — Z5181 Encounter for therapeutic drug level monitoring: Secondary | ICD-10-CM | POA: Diagnosis not present

## 2016-06-24 DIAGNOSIS — R634 Abnormal weight loss: Secondary | ICD-10-CM | POA: Diagnosis not present

## 2016-06-24 DIAGNOSIS — Z801 Family history of malignant neoplasm of trachea, bronchus and lung: Secondary | ICD-10-CM | POA: Diagnosis not present

## 2016-06-25 DIAGNOSIS — C50911 Malignant neoplasm of unspecified site of right female breast: Secondary | ICD-10-CM | POA: Diagnosis not present

## 2016-06-25 DIAGNOSIS — E876 Hypokalemia: Secondary | ICD-10-CM | POA: Diagnosis not present

## 2016-06-25 DIAGNOSIS — R112 Nausea with vomiting, unspecified: Secondary | ICD-10-CM | POA: Diagnosis not present

## 2016-07-01 DIAGNOSIS — Z801 Family history of malignant neoplasm of trachea, bronchus and lung: Secondary | ICD-10-CM | POA: Diagnosis not present

## 2016-07-01 DIAGNOSIS — Z853 Personal history of malignant neoplasm of breast: Secondary | ICD-10-CM | POA: Diagnosis not present

## 2016-07-01 DIAGNOSIS — C50911 Malignant neoplasm of unspecified site of right female breast: Secondary | ICD-10-CM | POA: Diagnosis not present

## 2016-07-01 DIAGNOSIS — Z7401 Bed confinement status: Secondary | ICD-10-CM | POA: Diagnosis not present

## 2016-07-01 DIAGNOSIS — E860 Dehydration: Secondary | ICD-10-CM | POA: Diagnosis not present

## 2016-07-01 DIAGNOSIS — E876 Hypokalemia: Secondary | ICD-10-CM | POA: Diagnosis not present

## 2016-07-01 DIAGNOSIS — E43 Unspecified severe protein-calorie malnutrition: Secondary | ICD-10-CM | POA: Diagnosis not present

## 2016-07-01 DIAGNOSIS — R627 Adult failure to thrive: Secondary | ICD-10-CM | POA: Diagnosis not present

## 2016-07-01 DIAGNOSIS — E46 Unspecified protein-calorie malnutrition: Secondary | ICD-10-CM | POA: Diagnosis not present

## 2016-07-01 DIAGNOSIS — Z9221 Personal history of antineoplastic chemotherapy: Secondary | ICD-10-CM | POA: Diagnosis not present

## 2016-07-01 DIAGNOSIS — R112 Nausea with vomiting, unspecified: Secondary | ICD-10-CM | POA: Diagnosis not present

## 2016-07-01 DIAGNOSIS — F329 Major depressive disorder, single episode, unspecified: Secondary | ICD-10-CM | POA: Diagnosis not present

## 2016-07-01 DIAGNOSIS — F419 Anxiety disorder, unspecified: Secondary | ICD-10-CM | POA: Diagnosis not present

## 2016-07-01 DIAGNOSIS — Z800 Family history of malignant neoplasm of digestive organs: Secondary | ICD-10-CM | POA: Diagnosis not present

## 2016-07-01 DIAGNOSIS — T451X5A Adverse effect of antineoplastic and immunosuppressive drugs, initial encounter: Secondary | ICD-10-CM | POA: Diagnosis not present

## 2016-07-02 DIAGNOSIS — E860 Dehydration: Secondary | ICD-10-CM | POA: Diagnosis not present

## 2016-07-02 DIAGNOSIS — E46 Unspecified protein-calorie malnutrition: Secondary | ICD-10-CM | POA: Diagnosis not present

## 2016-07-02 DIAGNOSIS — R627 Adult failure to thrive: Secondary | ICD-10-CM | POA: Diagnosis not present

## 2016-07-02 DIAGNOSIS — R112 Nausea with vomiting, unspecified: Secondary | ICD-10-CM | POA: Diagnosis not present

## 2016-07-12 DIAGNOSIS — Z79899 Other long term (current) drug therapy: Secondary | ICD-10-CM | POA: Diagnosis not present

## 2016-07-12 DIAGNOSIS — C50911 Malignant neoplasm of unspecified site of right female breast: Secondary | ICD-10-CM | POA: Diagnosis not present

## 2016-07-12 DIAGNOSIS — Z5181 Encounter for therapeutic drug level monitoring: Secondary | ICD-10-CM | POA: Diagnosis not present

## 2016-07-13 DIAGNOSIS — C50919 Malignant neoplasm of unspecified site of unspecified female breast: Secondary | ICD-10-CM | POA: Diagnosis not present

## 2016-07-13 DIAGNOSIS — R000 Tachycardia, unspecified: Secondary | ICD-10-CM | POA: Diagnosis not present

## 2016-07-13 DIAGNOSIS — E876 Hypokalemia: Secondary | ICD-10-CM | POA: Diagnosis not present

## 2016-07-13 DIAGNOSIS — T451X5A Adverse effect of antineoplastic and immunosuppressive drugs, initial encounter: Secondary | ICD-10-CM | POA: Diagnosis not present

## 2016-07-13 DIAGNOSIS — R112 Nausea with vomiting, unspecified: Secondary | ICD-10-CM | POA: Diagnosis not present

## 2016-07-14 DIAGNOSIS — R000 Tachycardia, unspecified: Secondary | ICD-10-CM | POA: Diagnosis not present

## 2016-07-15 DIAGNOSIS — Z800 Family history of malignant neoplasm of digestive organs: Secondary | ICD-10-CM | POA: Diagnosis not present

## 2016-07-15 DIAGNOSIS — R7989 Other specified abnormal findings of blood chemistry: Secondary | ICD-10-CM | POA: Diagnosis not present

## 2016-07-15 DIAGNOSIS — R931 Abnormal findings on diagnostic imaging of heart and coronary circulation: Secondary | ICD-10-CM | POA: Diagnosis not present

## 2016-07-15 DIAGNOSIS — Z170 Estrogen receptor positive status [ER+]: Secondary | ICD-10-CM | POA: Diagnosis not present

## 2016-07-15 DIAGNOSIS — I5189 Other ill-defined heart diseases: Secondary | ICD-10-CM | POA: Diagnosis not present

## 2016-07-15 DIAGNOSIS — E878 Other disorders of electrolyte and fluid balance, not elsewhere classified: Secondary | ICD-10-CM | POA: Diagnosis not present

## 2016-07-15 DIAGNOSIS — Z79899 Other long term (current) drug therapy: Secondary | ICD-10-CM | POA: Diagnosis not present

## 2016-07-15 DIAGNOSIS — Z5181 Encounter for therapeutic drug level monitoring: Secondary | ICD-10-CM | POA: Diagnosis not present

## 2016-07-15 DIAGNOSIS — F329 Major depressive disorder, single episode, unspecified: Secondary | ICD-10-CM | POA: Diagnosis not present

## 2016-07-15 DIAGNOSIS — C50911 Malignant neoplasm of unspecified site of right female breast: Secondary | ICD-10-CM | POA: Diagnosis not present

## 2016-07-15 DIAGNOSIS — F419 Anxiety disorder, unspecified: Secondary | ICD-10-CM | POA: Diagnosis not present

## 2016-07-22 DIAGNOSIS — R931 Abnormal findings on diagnostic imaging of heart and coronary circulation: Secondary | ICD-10-CM | POA: Diagnosis not present

## 2016-07-22 DIAGNOSIS — C50911 Malignant neoplasm of unspecified site of right female breast: Secondary | ICD-10-CM | POA: Diagnosis not present

## 2016-07-22 DIAGNOSIS — R112 Nausea with vomiting, unspecified: Secondary | ICD-10-CM | POA: Diagnosis not present

## 2016-07-22 DIAGNOSIS — Z79899 Other long term (current) drug therapy: Secondary | ICD-10-CM | POA: Diagnosis not present

## 2016-07-22 DIAGNOSIS — I5189 Other ill-defined heart diseases: Secondary | ICD-10-CM | POA: Diagnosis not present

## 2016-07-22 DIAGNOSIS — T451X5A Adverse effect of antineoplastic and immunosuppressive drugs, initial encounter: Secondary | ICD-10-CM | POA: Diagnosis not present

## 2016-07-22 DIAGNOSIS — D6481 Anemia due to antineoplastic chemotherapy: Secondary | ICD-10-CM | POA: Diagnosis not present

## 2016-07-22 DIAGNOSIS — Z5181 Encounter for therapeutic drug level monitoring: Secondary | ICD-10-CM | POA: Diagnosis not present

## 2016-07-26 DIAGNOSIS — R931 Abnormal findings on diagnostic imaging of heart and coronary circulation: Secondary | ICD-10-CM | POA: Diagnosis not present

## 2016-07-26 DIAGNOSIS — I5189 Other ill-defined heart diseases: Secondary | ICD-10-CM | POA: Diagnosis not present

## 2016-07-26 DIAGNOSIS — I427 Cardiomyopathy due to drug and external agent: Secondary | ICD-10-CM | POA: Diagnosis not present

## 2016-07-26 DIAGNOSIS — C50911 Malignant neoplasm of unspecified site of right female breast: Secondary | ICD-10-CM | POA: Diagnosis not present

## 2016-07-27 DIAGNOSIS — C50411 Malignant neoplasm of upper-outer quadrant of right female breast: Secondary | ICD-10-CM | POA: Diagnosis not present

## 2016-07-29 DIAGNOSIS — C50911 Malignant neoplasm of unspecified site of right female breast: Secondary | ICD-10-CM | POA: Diagnosis not present

## 2016-08-02 DIAGNOSIS — D509 Iron deficiency anemia, unspecified: Secondary | ICD-10-CM | POA: Insufficient documentation

## 2016-08-02 DIAGNOSIS — C50411 Malignant neoplasm of upper-outer quadrant of right female breast: Secondary | ICD-10-CM | POA: Diagnosis not present

## 2016-08-02 DIAGNOSIS — D508 Other iron deficiency anemias: Secondary | ICD-10-CM | POA: Diagnosis not present

## 2016-08-09 DIAGNOSIS — C50911 Malignant neoplasm of unspecified site of right female breast: Secondary | ICD-10-CM | POA: Diagnosis not present

## 2016-08-09 DIAGNOSIS — I5189 Other ill-defined heart diseases: Secondary | ICD-10-CM | POA: Diagnosis not present

## 2016-08-09 DIAGNOSIS — Z9221 Personal history of antineoplastic chemotherapy: Secondary | ICD-10-CM | POA: Diagnosis not present

## 2016-08-09 DIAGNOSIS — Z5181 Encounter for therapeutic drug level monitoring: Secondary | ICD-10-CM | POA: Diagnosis not present

## 2016-08-09 DIAGNOSIS — R931 Abnormal findings on diagnostic imaging of heart and coronary circulation: Secondary | ICD-10-CM | POA: Diagnosis not present

## 2016-08-10 DIAGNOSIS — C50911 Malignant neoplasm of unspecified site of right female breast: Secondary | ICD-10-CM | POA: Diagnosis not present

## 2016-08-10 DIAGNOSIS — C50411 Malignant neoplasm of upper-outer quadrant of right female breast: Secondary | ICD-10-CM | POA: Diagnosis not present

## 2016-08-10 DIAGNOSIS — Z170 Estrogen receptor positive status [ER+]: Secondary | ICD-10-CM | POA: Diagnosis not present

## 2016-08-10 DIAGNOSIS — Z9889 Other specified postprocedural states: Secondary | ICD-10-CM | POA: Diagnosis not present

## 2016-08-12 DIAGNOSIS — C50911 Malignant neoplasm of unspecified site of right female breast: Secondary | ICD-10-CM | POA: Diagnosis not present

## 2016-08-12 DIAGNOSIS — Z9221 Personal history of antineoplastic chemotherapy: Secondary | ICD-10-CM | POA: Diagnosis not present

## 2016-08-12 DIAGNOSIS — E876 Hypokalemia: Secondary | ICD-10-CM | POA: Diagnosis not present

## 2016-08-12 DIAGNOSIS — F329 Major depressive disorder, single episode, unspecified: Secondary | ICD-10-CM | POA: Diagnosis not present

## 2016-08-12 DIAGNOSIS — D6481 Anemia due to antineoplastic chemotherapy: Secondary | ICD-10-CM | POA: Diagnosis not present

## 2016-08-12 DIAGNOSIS — F419 Anxiety disorder, unspecified: Secondary | ICD-10-CM | POA: Diagnosis not present

## 2016-08-12 DIAGNOSIS — E039 Hypothyroidism, unspecified: Secondary | ICD-10-CM | POA: Diagnosis not present

## 2016-08-12 DIAGNOSIS — D0511 Intraductal carcinoma in situ of right breast: Secondary | ICD-10-CM | POA: Diagnosis not present

## 2016-08-12 DIAGNOSIS — C50411 Malignant neoplasm of upper-outer quadrant of right female breast: Secondary | ICD-10-CM | POA: Diagnosis not present

## 2016-08-12 DIAGNOSIS — Z79899 Other long term (current) drug therapy: Secondary | ICD-10-CM | POA: Diagnosis not present

## 2016-08-26 DIAGNOSIS — F329 Major depressive disorder, single episode, unspecified: Secondary | ICD-10-CM | POA: Diagnosis not present

## 2016-08-26 DIAGNOSIS — Z9221 Personal history of antineoplastic chemotherapy: Secondary | ICD-10-CM | POA: Diagnosis not present

## 2016-08-26 DIAGNOSIS — D649 Anemia, unspecified: Secondary | ICD-10-CM | POA: Diagnosis not present

## 2016-08-26 DIAGNOSIS — Z79899 Other long term (current) drug therapy: Secondary | ICD-10-CM | POA: Diagnosis not present

## 2016-08-26 DIAGNOSIS — C50911 Malignant neoplasm of unspecified site of right female breast: Secondary | ICD-10-CM | POA: Diagnosis not present

## 2016-08-26 DIAGNOSIS — F419 Anxiety disorder, unspecified: Secondary | ICD-10-CM | POA: Diagnosis not present

## 2016-08-26 DIAGNOSIS — Z5112 Encounter for antineoplastic immunotherapy: Secondary | ICD-10-CM | POA: Diagnosis not present

## 2016-08-26 DIAGNOSIS — R634 Abnormal weight loss: Secondary | ICD-10-CM | POA: Diagnosis not present

## 2016-08-26 DIAGNOSIS — Z170 Estrogen receptor positive status [ER+]: Secondary | ICD-10-CM | POA: Diagnosis not present

## 2016-09-01 DIAGNOSIS — Z170 Estrogen receptor positive status [ER+]: Secondary | ICD-10-CM | POA: Diagnosis not present

## 2016-09-01 DIAGNOSIS — C50411 Malignant neoplasm of upper-outer quadrant of right female breast: Secondary | ICD-10-CM | POA: Diagnosis not present

## 2016-09-07 DIAGNOSIS — Z9889 Other specified postprocedural states: Secondary | ICD-10-CM | POA: Diagnosis not present

## 2016-09-07 DIAGNOSIS — Z510 Encounter for antineoplastic radiation therapy: Secondary | ICD-10-CM | POA: Diagnosis not present

## 2016-09-07 DIAGNOSIS — C50911 Malignant neoplasm of unspecified site of right female breast: Secondary | ICD-10-CM | POA: Diagnosis not present

## 2016-09-07 DIAGNOSIS — R42 Dizziness and giddiness: Secondary | ICD-10-CM | POA: Diagnosis not present

## 2016-09-07 DIAGNOSIS — C50411 Malignant neoplasm of upper-outer quadrant of right female breast: Secondary | ICD-10-CM | POA: Diagnosis not present

## 2016-09-09 DIAGNOSIS — C50411 Malignant neoplasm of upper-outer quadrant of right female breast: Secondary | ICD-10-CM | POA: Diagnosis not present

## 2016-09-09 DIAGNOSIS — Z510 Encounter for antineoplastic radiation therapy: Secondary | ICD-10-CM | POA: Diagnosis not present

## 2016-09-09 DIAGNOSIS — Z9889 Other specified postprocedural states: Secondary | ICD-10-CM | POA: Diagnosis not present

## 2016-09-09 DIAGNOSIS — C50911 Malignant neoplasm of unspecified site of right female breast: Secondary | ICD-10-CM | POA: Diagnosis not present

## 2016-09-11 DIAGNOSIS — S99922A Unspecified injury of left foot, initial encounter: Secondary | ICD-10-CM | POA: Diagnosis not present

## 2016-09-15 DIAGNOSIS — C50911 Malignant neoplasm of unspecified site of right female breast: Secondary | ICD-10-CM | POA: Diagnosis not present

## 2016-09-15 DIAGNOSIS — C50411 Malignant neoplasm of upper-outer quadrant of right female breast: Secondary | ICD-10-CM | POA: Diagnosis not present

## 2016-09-15 DIAGNOSIS — Z510 Encounter for antineoplastic radiation therapy: Secondary | ICD-10-CM | POA: Diagnosis not present

## 2016-09-15 DIAGNOSIS — R7989 Other specified abnormal findings of blood chemistry: Secondary | ICD-10-CM | POA: Diagnosis not present

## 2016-09-15 DIAGNOSIS — Z5181 Encounter for therapeutic drug level monitoring: Secondary | ICD-10-CM | POA: Diagnosis not present

## 2016-09-15 DIAGNOSIS — Z9889 Other specified postprocedural states: Secondary | ICD-10-CM | POA: Diagnosis not present

## 2016-09-16 DIAGNOSIS — Z510 Encounter for antineoplastic radiation therapy: Secondary | ICD-10-CM | POA: Diagnosis not present

## 2016-09-16 DIAGNOSIS — S0083XD Contusion of other part of head, subsequent encounter: Secondary | ICD-10-CM | POA: Diagnosis not present

## 2016-09-16 DIAGNOSIS — C50911 Malignant neoplasm of unspecified site of right female breast: Secondary | ICD-10-CM | POA: Diagnosis not present

## 2016-09-16 DIAGNOSIS — Z5181 Encounter for therapeutic drug level monitoring: Secondary | ICD-10-CM | POA: Diagnosis not present

## 2016-09-16 DIAGNOSIS — R7989 Other specified abnormal findings of blood chemistry: Secondary | ICD-10-CM | POA: Diagnosis not present

## 2016-09-16 DIAGNOSIS — S90812D Abrasion, left foot, subsequent encounter: Secondary | ICD-10-CM | POA: Diagnosis not present

## 2016-09-16 DIAGNOSIS — D649 Anemia, unspecified: Secondary | ICD-10-CM | POA: Diagnosis not present

## 2016-09-16 DIAGNOSIS — C50411 Malignant neoplasm of upper-outer quadrant of right female breast: Secondary | ICD-10-CM | POA: Diagnosis not present

## 2016-09-16 DIAGNOSIS — Z9889 Other specified postprocedural states: Secondary | ICD-10-CM | POA: Diagnosis not present

## 2016-09-16 DIAGNOSIS — F419 Anxiety disorder, unspecified: Secondary | ICD-10-CM | POA: Diagnosis not present

## 2016-09-17 DIAGNOSIS — Z510 Encounter for antineoplastic radiation therapy: Secondary | ICD-10-CM | POA: Diagnosis not present

## 2016-09-17 DIAGNOSIS — C50411 Malignant neoplasm of upper-outer quadrant of right female breast: Secondary | ICD-10-CM | POA: Diagnosis not present

## 2016-09-17 DIAGNOSIS — Z9889 Other specified postprocedural states: Secondary | ICD-10-CM | POA: Diagnosis not present

## 2016-09-17 DIAGNOSIS — C50911 Malignant neoplasm of unspecified site of right female breast: Secondary | ICD-10-CM | POA: Diagnosis not present

## 2016-09-20 DIAGNOSIS — Z9889 Other specified postprocedural states: Secondary | ICD-10-CM | POA: Diagnosis not present

## 2016-09-20 DIAGNOSIS — Z510 Encounter for antineoplastic radiation therapy: Secondary | ICD-10-CM | POA: Diagnosis not present

## 2016-09-20 DIAGNOSIS — C50911 Malignant neoplasm of unspecified site of right female breast: Secondary | ICD-10-CM | POA: Diagnosis not present

## 2016-09-20 DIAGNOSIS — C50411 Malignant neoplasm of upper-outer quadrant of right female breast: Secondary | ICD-10-CM | POA: Diagnosis not present

## 2016-09-21 DIAGNOSIS — C50411 Malignant neoplasm of upper-outer quadrant of right female breast: Secondary | ICD-10-CM | POA: Diagnosis not present

## 2016-09-21 DIAGNOSIS — Z9889 Other specified postprocedural states: Secondary | ICD-10-CM | POA: Diagnosis not present

## 2016-09-21 DIAGNOSIS — Z510 Encounter for antineoplastic radiation therapy: Secondary | ICD-10-CM | POA: Diagnosis not present

## 2016-09-21 DIAGNOSIS — C50911 Malignant neoplasm of unspecified site of right female breast: Secondary | ICD-10-CM | POA: Diagnosis not present

## 2016-09-22 DIAGNOSIS — C50911 Malignant neoplasm of unspecified site of right female breast: Secondary | ICD-10-CM | POA: Diagnosis not present

## 2016-09-22 DIAGNOSIS — Z510 Encounter for antineoplastic radiation therapy: Secondary | ICD-10-CM | POA: Diagnosis not present

## 2016-09-22 DIAGNOSIS — C50411 Malignant neoplasm of upper-outer quadrant of right female breast: Secondary | ICD-10-CM | POA: Diagnosis not present

## 2016-09-22 DIAGNOSIS — Z9889 Other specified postprocedural states: Secondary | ICD-10-CM | POA: Diagnosis not present

## 2016-09-23 DIAGNOSIS — C50911 Malignant neoplasm of unspecified site of right female breast: Secondary | ICD-10-CM | POA: Diagnosis not present

## 2016-09-23 DIAGNOSIS — Z9889 Other specified postprocedural states: Secondary | ICD-10-CM | POA: Diagnosis not present

## 2016-09-23 DIAGNOSIS — C50411 Malignant neoplasm of upper-outer quadrant of right female breast: Secondary | ICD-10-CM | POA: Diagnosis not present

## 2016-09-23 DIAGNOSIS — Z510 Encounter for antineoplastic radiation therapy: Secondary | ICD-10-CM | POA: Diagnosis not present

## 2016-09-24 DIAGNOSIS — Z510 Encounter for antineoplastic radiation therapy: Secondary | ICD-10-CM | POA: Diagnosis not present

## 2016-09-24 DIAGNOSIS — C50411 Malignant neoplasm of upper-outer quadrant of right female breast: Secondary | ICD-10-CM | POA: Diagnosis not present

## 2016-09-24 DIAGNOSIS — C50911 Malignant neoplasm of unspecified site of right female breast: Secondary | ICD-10-CM | POA: Diagnosis not present

## 2016-09-24 DIAGNOSIS — Z9889 Other specified postprocedural states: Secondary | ICD-10-CM | POA: Diagnosis not present

## 2016-09-27 DIAGNOSIS — C50411 Malignant neoplasm of upper-outer quadrant of right female breast: Secondary | ICD-10-CM | POA: Diagnosis not present

## 2016-09-27 DIAGNOSIS — Z9889 Other specified postprocedural states: Secondary | ICD-10-CM | POA: Diagnosis not present

## 2016-09-27 DIAGNOSIS — C50911 Malignant neoplasm of unspecified site of right female breast: Secondary | ICD-10-CM | POA: Diagnosis not present

## 2016-09-27 DIAGNOSIS — Z510 Encounter for antineoplastic radiation therapy: Secondary | ICD-10-CM | POA: Diagnosis not present

## 2016-09-28 DIAGNOSIS — Z9889 Other specified postprocedural states: Secondary | ICD-10-CM | POA: Diagnosis not present

## 2016-09-28 DIAGNOSIS — Z510 Encounter for antineoplastic radiation therapy: Secondary | ICD-10-CM | POA: Diagnosis not present

## 2016-09-28 DIAGNOSIS — C50411 Malignant neoplasm of upper-outer quadrant of right female breast: Secondary | ICD-10-CM | POA: Diagnosis not present

## 2016-09-28 DIAGNOSIS — C50911 Malignant neoplasm of unspecified site of right female breast: Secondary | ICD-10-CM | POA: Diagnosis not present

## 2016-09-29 DIAGNOSIS — Z510 Encounter for antineoplastic radiation therapy: Secondary | ICD-10-CM | POA: Diagnosis not present

## 2016-09-29 DIAGNOSIS — C50411 Malignant neoplasm of upper-outer quadrant of right female breast: Secondary | ICD-10-CM | POA: Diagnosis not present

## 2016-09-29 DIAGNOSIS — Z9889 Other specified postprocedural states: Secondary | ICD-10-CM | POA: Diagnosis not present

## 2016-09-29 DIAGNOSIS — C50911 Malignant neoplasm of unspecified site of right female breast: Secondary | ICD-10-CM | POA: Diagnosis not present

## 2016-09-30 DIAGNOSIS — Z9889 Other specified postprocedural states: Secondary | ICD-10-CM | POA: Diagnosis not present

## 2016-09-30 DIAGNOSIS — C50911 Malignant neoplasm of unspecified site of right female breast: Secondary | ICD-10-CM | POA: Diagnosis not present

## 2016-09-30 DIAGNOSIS — Z510 Encounter for antineoplastic radiation therapy: Secondary | ICD-10-CM | POA: Diagnosis not present

## 2016-09-30 DIAGNOSIS — C50411 Malignant neoplasm of upper-outer quadrant of right female breast: Secondary | ICD-10-CM | POA: Diagnosis not present

## 2016-10-01 DIAGNOSIS — Z9889 Other specified postprocedural states: Secondary | ICD-10-CM | POA: Diagnosis not present

## 2016-10-01 DIAGNOSIS — C50411 Malignant neoplasm of upper-outer quadrant of right female breast: Secondary | ICD-10-CM | POA: Diagnosis not present

## 2016-10-01 DIAGNOSIS — Z510 Encounter for antineoplastic radiation therapy: Secondary | ICD-10-CM | POA: Diagnosis not present

## 2016-10-01 DIAGNOSIS — C50911 Malignant neoplasm of unspecified site of right female breast: Secondary | ICD-10-CM | POA: Diagnosis not present

## 2016-10-05 DIAGNOSIS — C50911 Malignant neoplasm of unspecified site of right female breast: Secondary | ICD-10-CM | POA: Diagnosis not present

## 2016-10-05 DIAGNOSIS — Z510 Encounter for antineoplastic radiation therapy: Secondary | ICD-10-CM | POA: Diagnosis not present

## 2016-10-05 DIAGNOSIS — Z170 Estrogen receptor positive status [ER+]: Secondary | ICD-10-CM | POA: Diagnosis not present

## 2016-10-05 DIAGNOSIS — Z9889 Other specified postprocedural states: Secondary | ICD-10-CM | POA: Diagnosis not present

## 2016-10-05 DIAGNOSIS — C50411 Malignant neoplasm of upper-outer quadrant of right female breast: Secondary | ICD-10-CM | POA: Diagnosis not present

## 2016-10-06 DIAGNOSIS — Z510 Encounter for antineoplastic radiation therapy: Secondary | ICD-10-CM | POA: Diagnosis not present

## 2016-10-06 DIAGNOSIS — C50411 Malignant neoplasm of upper-outer quadrant of right female breast: Secondary | ICD-10-CM | POA: Diagnosis not present

## 2016-10-06 DIAGNOSIS — C50911 Malignant neoplasm of unspecified site of right female breast: Secondary | ICD-10-CM | POA: Diagnosis not present

## 2016-10-06 DIAGNOSIS — Z9889 Other specified postprocedural states: Secondary | ICD-10-CM | POA: Diagnosis not present

## 2016-10-06 DIAGNOSIS — Z170 Estrogen receptor positive status [ER+]: Secondary | ICD-10-CM | POA: Diagnosis not present

## 2016-10-07 DIAGNOSIS — Z9889 Other specified postprocedural states: Secondary | ICD-10-CM | POA: Diagnosis not present

## 2016-10-07 DIAGNOSIS — Z5111 Encounter for antineoplastic chemotherapy: Secondary | ICD-10-CM | POA: Diagnosis not present

## 2016-10-07 DIAGNOSIS — C50911 Malignant neoplasm of unspecified site of right female breast: Secondary | ICD-10-CM | POA: Diagnosis not present

## 2016-10-07 DIAGNOSIS — Z79899 Other long term (current) drug therapy: Secondary | ICD-10-CM | POA: Diagnosis not present

## 2016-10-07 DIAGNOSIS — C50411 Malignant neoplasm of upper-outer quadrant of right female breast: Secondary | ICD-10-CM | POA: Diagnosis not present

## 2016-10-07 DIAGNOSIS — Z170 Estrogen receptor positive status [ER+]: Secondary | ICD-10-CM | POA: Diagnosis not present

## 2016-10-07 DIAGNOSIS — Z510 Encounter for antineoplastic radiation therapy: Secondary | ICD-10-CM | POA: Diagnosis not present

## 2016-10-12 DIAGNOSIS — H109 Unspecified conjunctivitis: Secondary | ICD-10-CM | POA: Diagnosis not present

## 2016-10-22 DIAGNOSIS — I5022 Chronic systolic (congestive) heart failure: Secondary | ICD-10-CM | POA: Diagnosis not present

## 2016-10-22 DIAGNOSIS — T451X5A Adverse effect of antineoplastic and immunosuppressive drugs, initial encounter: Secondary | ICD-10-CM | POA: Diagnosis not present

## 2016-10-22 DIAGNOSIS — I427 Cardiomyopathy due to drug and external agent: Secondary | ICD-10-CM | POA: Diagnosis not present

## 2016-10-27 DIAGNOSIS — C50911 Malignant neoplasm of unspecified site of right female breast: Secondary | ICD-10-CM | POA: Diagnosis not present

## 2016-10-27 DIAGNOSIS — Z5181 Encounter for therapeutic drug level monitoring: Secondary | ICD-10-CM | POA: Diagnosis not present

## 2016-10-27 DIAGNOSIS — Z79899 Other long term (current) drug therapy: Secondary | ICD-10-CM | POA: Diagnosis not present

## 2016-10-28 DIAGNOSIS — C50911 Malignant neoplasm of unspecified site of right female breast: Secondary | ICD-10-CM | POA: Diagnosis not present

## 2016-10-28 DIAGNOSIS — E871 Hypo-osmolality and hyponatremia: Secondary | ICD-10-CM | POA: Diagnosis not present

## 2016-10-28 DIAGNOSIS — R7989 Other specified abnormal findings of blood chemistry: Secondary | ICD-10-CM | POA: Diagnosis not present

## 2016-10-28 DIAGNOSIS — F419 Anxiety disorder, unspecified: Secondary | ICD-10-CM | POA: Diagnosis not present

## 2016-10-28 DIAGNOSIS — R634 Abnormal weight loss: Secondary | ICD-10-CM | POA: Diagnosis not present

## 2016-10-28 DIAGNOSIS — Z5112 Encounter for antineoplastic immunotherapy: Secondary | ICD-10-CM | POA: Diagnosis not present

## 2016-10-28 DIAGNOSIS — Z9221 Personal history of antineoplastic chemotherapy: Secondary | ICD-10-CM | POA: Diagnosis not present

## 2016-10-28 DIAGNOSIS — D649 Anemia, unspecified: Secondary | ICD-10-CM | POA: Diagnosis not present

## 2016-11-18 DIAGNOSIS — Z5112 Encounter for antineoplastic immunotherapy: Secondary | ICD-10-CM | POA: Diagnosis not present

## 2016-11-18 DIAGNOSIS — Z3202 Encounter for pregnancy test, result negative: Secondary | ICD-10-CM | POA: Diagnosis not present

## 2016-11-18 DIAGNOSIS — C50911 Malignant neoplasm of unspecified site of right female breast: Secondary | ICD-10-CM | POA: Diagnosis not present

## 2016-12-09 DIAGNOSIS — Z9221 Personal history of antineoplastic chemotherapy: Secondary | ICD-10-CM | POA: Diagnosis not present

## 2016-12-09 DIAGNOSIS — D649 Anemia, unspecified: Secondary | ICD-10-CM | POA: Diagnosis not present

## 2016-12-09 DIAGNOSIS — Z79899 Other long term (current) drug therapy: Secondary | ICD-10-CM | POA: Diagnosis not present

## 2016-12-09 DIAGNOSIS — F419 Anxiety disorder, unspecified: Secondary | ICD-10-CM | POA: Diagnosis not present

## 2016-12-09 DIAGNOSIS — R627 Adult failure to thrive: Secondary | ICD-10-CM | POA: Diagnosis not present

## 2016-12-09 DIAGNOSIS — Z5111 Encounter for antineoplastic chemotherapy: Secondary | ICD-10-CM | POA: Diagnosis not present

## 2016-12-09 DIAGNOSIS — Z5181 Encounter for therapeutic drug level monitoring: Secondary | ICD-10-CM | POA: Diagnosis not present

## 2016-12-09 DIAGNOSIS — R634 Abnormal weight loss: Secondary | ICD-10-CM | POA: Diagnosis not present

## 2016-12-09 DIAGNOSIS — Z2821 Immunization not carried out because of patient refusal: Secondary | ICD-10-CM | POA: Diagnosis not present

## 2016-12-09 DIAGNOSIS — C50911 Malignant neoplasm of unspecified site of right female breast: Secondary | ICD-10-CM | POA: Diagnosis not present

## 2016-12-09 DIAGNOSIS — R7989 Other specified abnormal findings of blood chemistry: Secondary | ICD-10-CM | POA: Diagnosis not present

## 2016-12-30 DIAGNOSIS — D649 Anemia, unspecified: Secondary | ICD-10-CM | POA: Diagnosis not present

## 2016-12-30 DIAGNOSIS — F419 Anxiety disorder, unspecified: Secondary | ICD-10-CM | POA: Diagnosis not present

## 2016-12-30 DIAGNOSIS — Z5111 Encounter for antineoplastic chemotherapy: Secondary | ICD-10-CM | POA: Diagnosis not present

## 2016-12-30 DIAGNOSIS — Z79899 Other long term (current) drug therapy: Secondary | ICD-10-CM | POA: Diagnosis not present

## 2016-12-30 DIAGNOSIS — C50911 Malignant neoplasm of unspecified site of right female breast: Secondary | ICD-10-CM | POA: Diagnosis not present

## 2017-01-20 DIAGNOSIS — Z79899 Other long term (current) drug therapy: Secondary | ICD-10-CM | POA: Diagnosis not present

## 2017-01-20 DIAGNOSIS — Z5181 Encounter for therapeutic drug level monitoring: Secondary | ICD-10-CM | POA: Diagnosis not present

## 2017-01-20 DIAGNOSIS — C50911 Malignant neoplasm of unspecified site of right female breast: Secondary | ICD-10-CM | POA: Diagnosis not present

## 2017-02-10 DIAGNOSIS — Z9889 Other specified postprocedural states: Secondary | ICD-10-CM | POA: Diagnosis not present

## 2017-02-10 DIAGNOSIS — F419 Anxiety disorder, unspecified: Secondary | ICD-10-CM | POA: Diagnosis not present

## 2017-02-10 DIAGNOSIS — Z853 Personal history of malignant neoplasm of breast: Secondary | ICD-10-CM | POA: Diagnosis not present

## 2017-02-10 DIAGNOSIS — Z08 Encounter for follow-up examination after completed treatment for malignant neoplasm: Secondary | ICD-10-CM | POA: Diagnosis not present

## 2017-02-10 DIAGNOSIS — Z923 Personal history of irradiation: Secondary | ICD-10-CM | POA: Diagnosis not present

## 2017-02-10 DIAGNOSIS — D649 Anemia, unspecified: Secondary | ICD-10-CM | POA: Diagnosis not present

## 2017-02-10 DIAGNOSIS — Z800 Family history of malignant neoplasm of digestive organs: Secondary | ICD-10-CM | POA: Diagnosis not present

## 2017-02-10 DIAGNOSIS — R7989 Other specified abnormal findings of blood chemistry: Secondary | ICD-10-CM | POA: Diagnosis not present

## 2017-02-10 DIAGNOSIS — C50911 Malignant neoplasm of unspecified site of right female breast: Secondary | ICD-10-CM | POA: Diagnosis not present

## 2017-02-10 DIAGNOSIS — E878 Other disorders of electrolyte and fluid balance, not elsewhere classified: Secondary | ICD-10-CM | POA: Diagnosis not present

## 2017-02-10 DIAGNOSIS — Z79899 Other long term (current) drug therapy: Secondary | ICD-10-CM | POA: Diagnosis not present

## 2017-02-28 ENCOUNTER — Other Ambulatory Visit: Payer: Self-pay | Admitting: Hematology and Oncology

## 2017-03-01 DIAGNOSIS — K743 Primary biliary cirrhosis: Secondary | ICD-10-CM | POA: Diagnosis not present

## 2017-03-01 DIAGNOSIS — Z1211 Encounter for screening for malignant neoplasm of colon: Secondary | ICD-10-CM | POA: Diagnosis not present

## 2017-03-01 DIAGNOSIS — K7689 Other specified diseases of liver: Secondary | ICD-10-CM | POA: Diagnosis not present

## 2017-03-01 DIAGNOSIS — Z800 Family history of malignant neoplasm of digestive organs: Secondary | ICD-10-CM | POA: Diagnosis not present

## 2017-03-03 DIAGNOSIS — C50911 Malignant neoplasm of unspecified site of right female breast: Secondary | ICD-10-CM | POA: Diagnosis not present

## 2017-03-03 DIAGNOSIS — Z5111 Encounter for antineoplastic chemotherapy: Secondary | ICD-10-CM | POA: Diagnosis not present

## 2017-03-08 DIAGNOSIS — Z08 Encounter for follow-up examination after completed treatment for malignant neoplasm: Secondary | ICD-10-CM | POA: Diagnosis not present

## 2017-03-08 DIAGNOSIS — C50811 Malignant neoplasm of overlapping sites of right female breast: Secondary | ICD-10-CM | POA: Diagnosis not present

## 2017-03-08 DIAGNOSIS — Z853 Personal history of malignant neoplasm of breast: Secondary | ICD-10-CM | POA: Diagnosis not present

## 2017-03-11 ENCOUNTER — Other Ambulatory Visit: Payer: Self-pay | Admitting: Gastroenterology

## 2017-03-11 DIAGNOSIS — R7989 Other specified abnormal findings of blood chemistry: Secondary | ICD-10-CM

## 2017-03-11 DIAGNOSIS — R945 Abnormal results of liver function studies: Principal | ICD-10-CM

## 2017-03-24 DIAGNOSIS — Z9889 Other specified postprocedural states: Secondary | ICD-10-CM | POA: Diagnosis not present

## 2017-03-24 DIAGNOSIS — F419 Anxiety disorder, unspecified: Secondary | ICD-10-CM | POA: Diagnosis not present

## 2017-03-24 DIAGNOSIS — Z801 Family history of malignant neoplasm of trachea, bronchus and lung: Secondary | ICD-10-CM | POA: Diagnosis not present

## 2017-03-24 DIAGNOSIS — R7989 Other specified abnormal findings of blood chemistry: Secondary | ICD-10-CM | POA: Diagnosis not present

## 2017-03-24 DIAGNOSIS — Z79899 Other long term (current) drug therapy: Secondary | ICD-10-CM | POA: Diagnosis not present

## 2017-03-24 DIAGNOSIS — Z800 Family history of malignant neoplasm of digestive organs: Secondary | ICD-10-CM | POA: Diagnosis not present

## 2017-03-24 DIAGNOSIS — E878 Other disorders of electrolyte and fluid balance, not elsewhere classified: Secondary | ICD-10-CM | POA: Diagnosis not present

## 2017-03-24 DIAGNOSIS — C50911 Malignant neoplasm of unspecified site of right female breast: Secondary | ICD-10-CM | POA: Diagnosis not present

## 2017-03-24 DIAGNOSIS — Z170 Estrogen receptor positive status [ER+]: Secondary | ICD-10-CM | POA: Diagnosis not present

## 2017-03-24 DIAGNOSIS — D649 Anemia, unspecified: Secondary | ICD-10-CM | POA: Diagnosis not present

## 2017-03-25 ENCOUNTER — Ambulatory Visit
Admission: RE | Admit: 2017-03-25 | Discharge: 2017-03-25 | Disposition: A | Discharge status: Home / Self Care | Payer: BLUE CROSS/BLUE SHIELD | Source: Ambulatory Visit | Attending: Gastroenterology | Admitting: Gastroenterology

## 2017-03-25 DIAGNOSIS — R7989 Other specified abnormal findings of blood chemistry: Secondary | ICD-10-CM | POA: Diagnosis not present

## 2017-03-28 ENCOUNTER — Other Ambulatory Visit: Payer: Self-pay | Admitting: Gastroenterology

## 2017-03-28 DIAGNOSIS — K769 Liver disease, unspecified: Secondary | ICD-10-CM

## 2017-03-29 ENCOUNTER — Ambulatory Visit
Admission: RE | Admit: 2017-03-29 | Discharge: 2017-03-29 | Disposition: A | Discharge status: Home / Self Care | Payer: BLUE CROSS/BLUE SHIELD | Source: Ambulatory Visit | Attending: Gastroenterology | Admitting: Gastroenterology

## 2017-03-29 DIAGNOSIS — K769 Liver disease, unspecified: Secondary | ICD-10-CM

## 2017-03-29 DIAGNOSIS — R7989 Other specified abnormal findings of blood chemistry: Secondary | ICD-10-CM | POA: Diagnosis not present

## 2017-03-29 MED ORDER — GADOBENATE DIMEGLUMINE 529 MG/ML IV SOLN
13.0000 mL | Freq: Once | INTRAVENOUS | Status: AC | PRN
  Administered 2017-03-29: 13 mL via INTRAVENOUS

## 2017-03-30 ENCOUNTER — Other Ambulatory Visit (HOSPITAL_COMMUNITY)
Admission: RE | Admit: 2017-03-30 | Discharge: 2017-03-30 | Disposition: A | Discharge status: Home / Self Care | Payer: BLUE CROSS/BLUE SHIELD | Source: Ambulatory Visit | Attending: Family Medicine | Admitting: Family Medicine

## 2017-03-30 ENCOUNTER — Other Ambulatory Visit: Payer: Self-pay | Admitting: Family Medicine

## 2017-03-30 DIAGNOSIS — F419 Anxiety disorder, unspecified: Secondary | ICD-10-CM | POA: Diagnosis not present

## 2017-03-30 DIAGNOSIS — Z01411 Encounter for gynecological examination (general) (routine) with abnormal findings: Secondary | ICD-10-CM | POA: Diagnosis not present

## 2017-03-30 DIAGNOSIS — E039 Hypothyroidism, unspecified: Secondary | ICD-10-CM | POA: Diagnosis not present

## 2017-03-30 DIAGNOSIS — Z0000 Encounter for general adult medical examination without abnormal findings: Secondary | ICD-10-CM | POA: Diagnosis not present

## 2017-03-30 DIAGNOSIS — E7800 Pure hypercholesterolemia, unspecified: Secondary | ICD-10-CM | POA: Diagnosis not present

## 2017-03-31 LAB — CYTOLOGY - PAP: DIAGNOSIS: NEGATIVE

## 2017-04-14 DIAGNOSIS — Z5111 Encounter for antineoplastic chemotherapy: Secondary | ICD-10-CM | POA: Diagnosis not present

## 2017-04-14 DIAGNOSIS — C50911 Malignant neoplasm of unspecified site of right female breast: Secondary | ICD-10-CM | POA: Diagnosis not present

## 2017-04-15 DIAGNOSIS — Z170 Estrogen receptor positive status [ER+]: Secondary | ICD-10-CM | POA: Diagnosis not present

## 2017-04-15 DIAGNOSIS — C50911 Malignant neoplasm of unspecified site of right female breast: Secondary | ICD-10-CM | POA: Diagnosis not present

## 2017-04-15 DIAGNOSIS — C50411 Malignant neoplasm of upper-outer quadrant of right female breast: Secondary | ICD-10-CM | POA: Diagnosis not present

## 2017-04-18 DIAGNOSIS — L309 Dermatitis, unspecified: Secondary | ICD-10-CM | POA: Diagnosis not present

## 2017-04-22 DIAGNOSIS — Z800 Family history of malignant neoplasm of digestive organs: Secondary | ICD-10-CM | POA: Diagnosis not present

## 2017-04-22 DIAGNOSIS — K635 Polyp of colon: Secondary | ICD-10-CM | POA: Diagnosis not present

## 2017-04-22 DIAGNOSIS — Z1211 Encounter for screening for malignant neoplasm of colon: Secondary | ICD-10-CM | POA: Diagnosis not present

## 2017-04-22 DIAGNOSIS — D120 Benign neoplasm of cecum: Secondary | ICD-10-CM | POA: Diagnosis not present

## 2017-05-04 DIAGNOSIS — D649 Anemia, unspecified: Secondary | ICD-10-CM | POA: Diagnosis not present

## 2017-05-05 DIAGNOSIS — Z79899 Other long term (current) drug therapy: Secondary | ICD-10-CM | POA: Diagnosis not present

## 2017-05-05 DIAGNOSIS — R931 Abnormal findings on diagnostic imaging of heart and coronary circulation: Secondary | ICD-10-CM | POA: Diagnosis not present

## 2017-05-05 DIAGNOSIS — Z801 Family history of malignant neoplasm of trachea, bronchus and lung: Secondary | ICD-10-CM | POA: Diagnosis not present

## 2017-05-05 DIAGNOSIS — Z923 Personal history of irradiation: Secondary | ICD-10-CM | POA: Diagnosis not present

## 2017-05-05 DIAGNOSIS — C50911 Malignant neoplasm of unspecified site of right female breast: Secondary | ICD-10-CM | POA: Diagnosis not present

## 2017-05-05 DIAGNOSIS — R7989 Other specified abnormal findings of blood chemistry: Secondary | ICD-10-CM | POA: Diagnosis not present

## 2017-05-05 DIAGNOSIS — D649 Anemia, unspecified: Secondary | ICD-10-CM | POA: Diagnosis not present

## 2017-05-05 DIAGNOSIS — F419 Anxiety disorder, unspecified: Secondary | ICD-10-CM | POA: Diagnosis not present

## 2017-05-05 DIAGNOSIS — Z800 Family history of malignant neoplasm of digestive organs: Secondary | ICD-10-CM | POA: Diagnosis not present

## 2017-05-05 DIAGNOSIS — Z170 Estrogen receptor positive status [ER+]: Secondary | ICD-10-CM | POA: Diagnosis not present

## 2017-05-05 DIAGNOSIS — Z9889 Other specified postprocedural states: Secondary | ICD-10-CM | POA: Diagnosis not present

## 2017-05-23 DIAGNOSIS — J101 Influenza due to other identified influenza virus with other respiratory manifestations: Secondary | ICD-10-CM | POA: Diagnosis not present

## 2017-05-23 DIAGNOSIS — R6889 Other general symptoms and signs: Secondary | ICD-10-CM | POA: Diagnosis not present

## 2017-05-26 DIAGNOSIS — C50911 Malignant neoplasm of unspecified site of right female breast: Secondary | ICD-10-CM | POA: Diagnosis not present

## 2017-05-26 DIAGNOSIS — Z5112 Encounter for antineoplastic immunotherapy: Secondary | ICD-10-CM | POA: Diagnosis not present

## 2017-08-03 DIAGNOSIS — C50911 Malignant neoplasm of unspecified site of right female breast: Secondary | ICD-10-CM | POA: Diagnosis not present

## 2017-08-08 DIAGNOSIS — S61011A Laceration without foreign body of right thumb without damage to nail, initial encounter: Secondary | ICD-10-CM | POA: Diagnosis not present

## 2017-08-08 DIAGNOSIS — Z79810 Long term (current) use of selective estrogen receptor modulators (SERMs): Secondary | ICD-10-CM | POA: Diagnosis not present

## 2017-08-08 DIAGNOSIS — G8911 Acute pain due to trauma: Secondary | ICD-10-CM | POA: Diagnosis not present

## 2017-08-08 DIAGNOSIS — Z923 Personal history of irradiation: Secondary | ICD-10-CM | POA: Diagnosis not present

## 2017-08-08 DIAGNOSIS — Z79899 Other long term (current) drug therapy: Secondary | ICD-10-CM | POA: Diagnosis not present

## 2017-08-08 DIAGNOSIS — Z853 Personal history of malignant neoplasm of breast: Secondary | ICD-10-CM | POA: Diagnosis not present

## 2017-08-08 DIAGNOSIS — Z9221 Personal history of antineoplastic chemotherapy: Secondary | ICD-10-CM | POA: Diagnosis not present

## 2017-09-02 DIAGNOSIS — C50911 Malignant neoplasm of unspecified site of right female breast: Secondary | ICD-10-CM | POA: Diagnosis not present

## 2017-09-02 DIAGNOSIS — Z452 Encounter for adjustment and management of vascular access device: Secondary | ICD-10-CM | POA: Diagnosis not present

## 2017-11-17 DIAGNOSIS — H524 Presbyopia: Secondary | ICD-10-CM | POA: Diagnosis not present

## 2017-12-02 DIAGNOSIS — C50911 Malignant neoplasm of unspecified site of right female breast: Secondary | ICD-10-CM | POA: Diagnosis not present

## 2018-06-16 IMAGING — MG 2D DIGITAL DIAGNOSTIC BILATERAL MAMMOGRAM WITH CAD AND ADJUNCT T
8 of 15 series · 8 of 35 positions shown · non-contrast
Comparison: Previous exam(s).

CLINICAL DATA: Right breast 12 o'clock palpable mass felt
clinically.

EXAM:
2D DIGITAL DIAGNOSTIC BILATERAL MAMMOGRAM WITH CAD AND ADJUNCT TOMO
ULTRASOUND RIGHT BREAST

[R CC synth-2D]
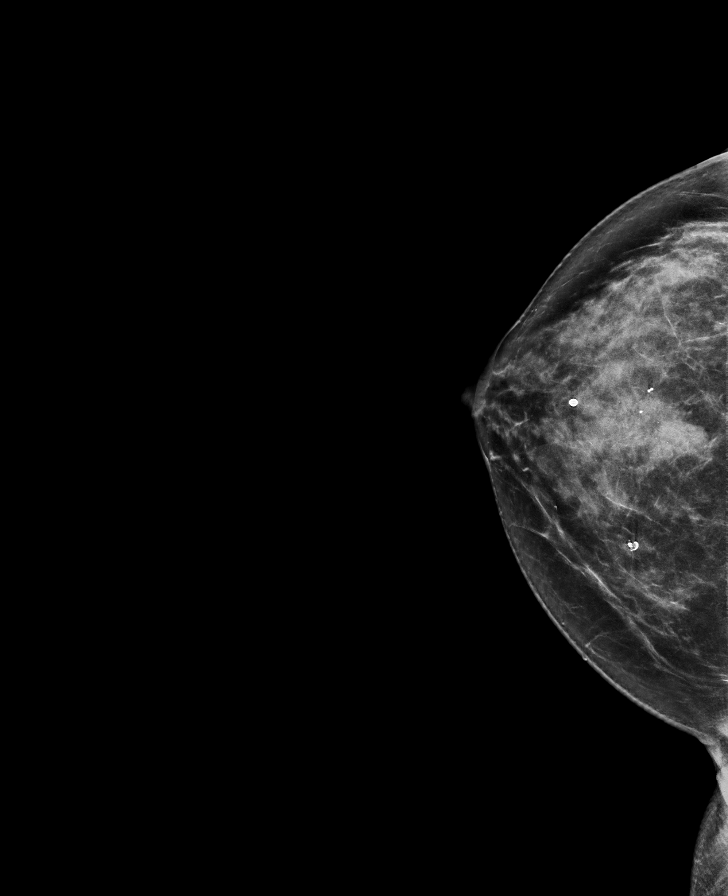

[R MLO synth-2D]
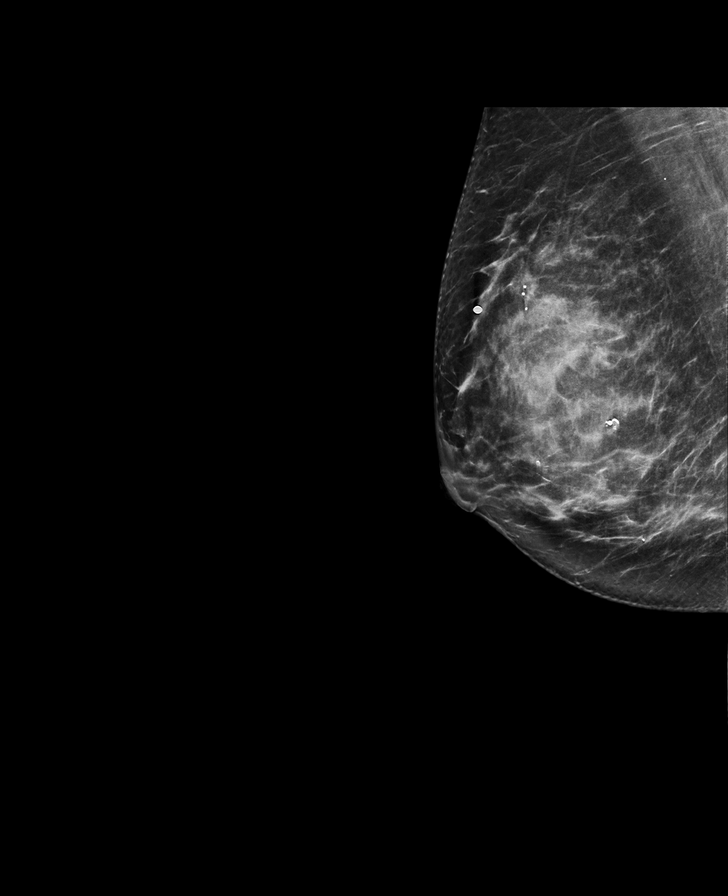

[L MLO]
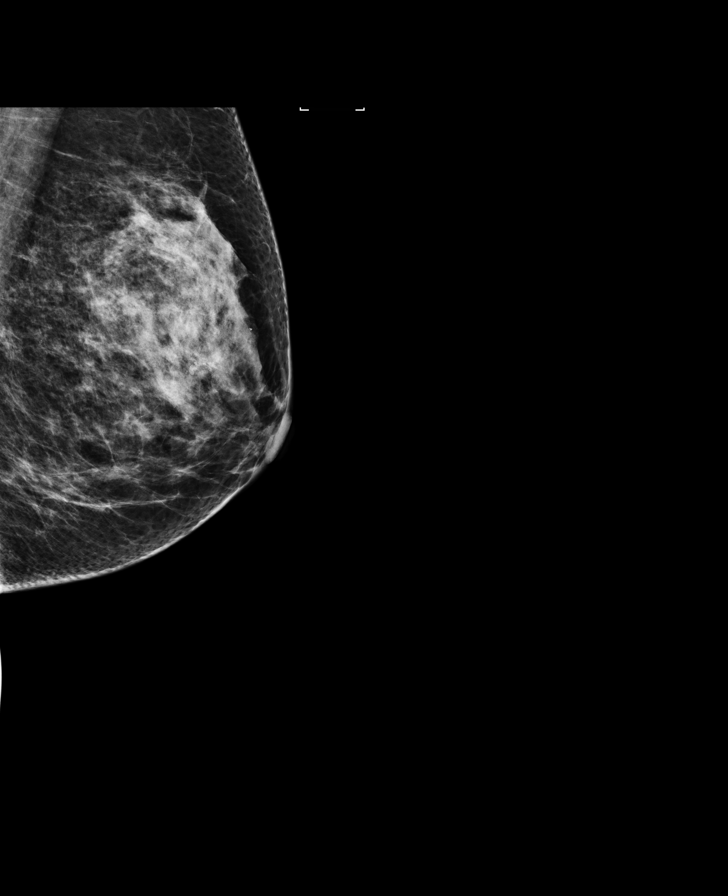

[R TAN synth-2D]
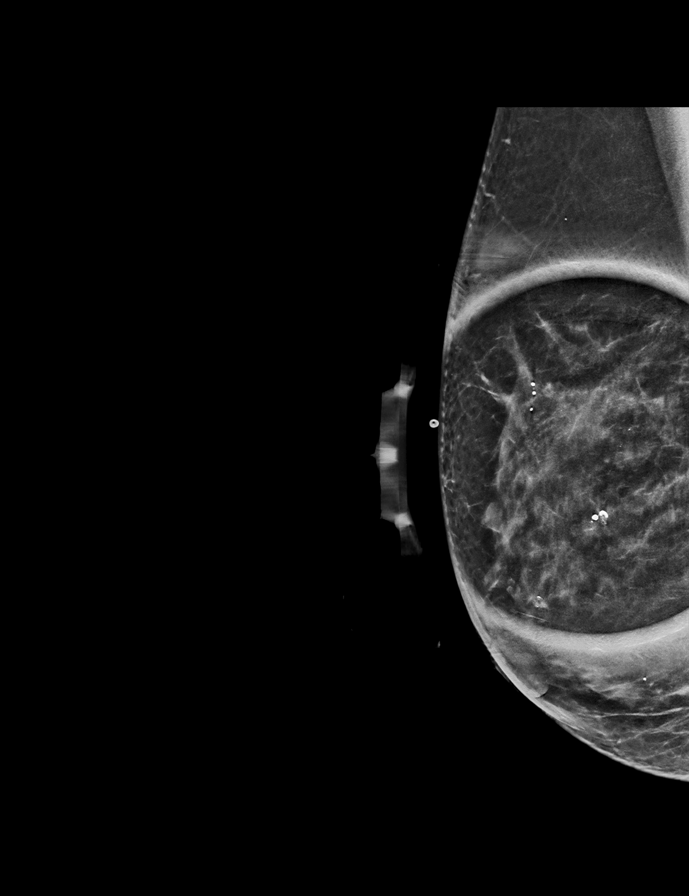

[L CC synth-2D]
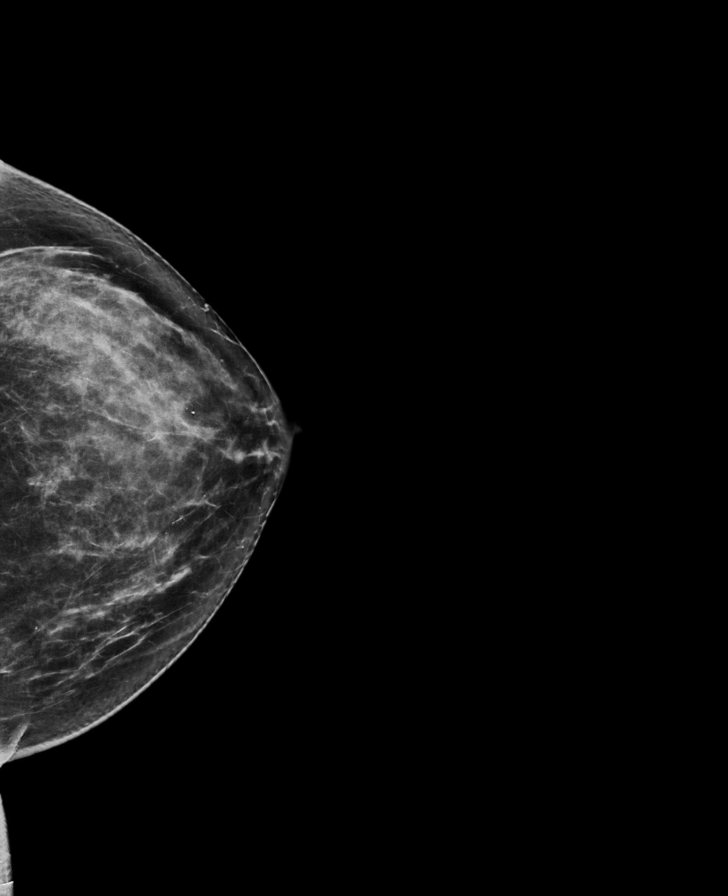

[R CC]
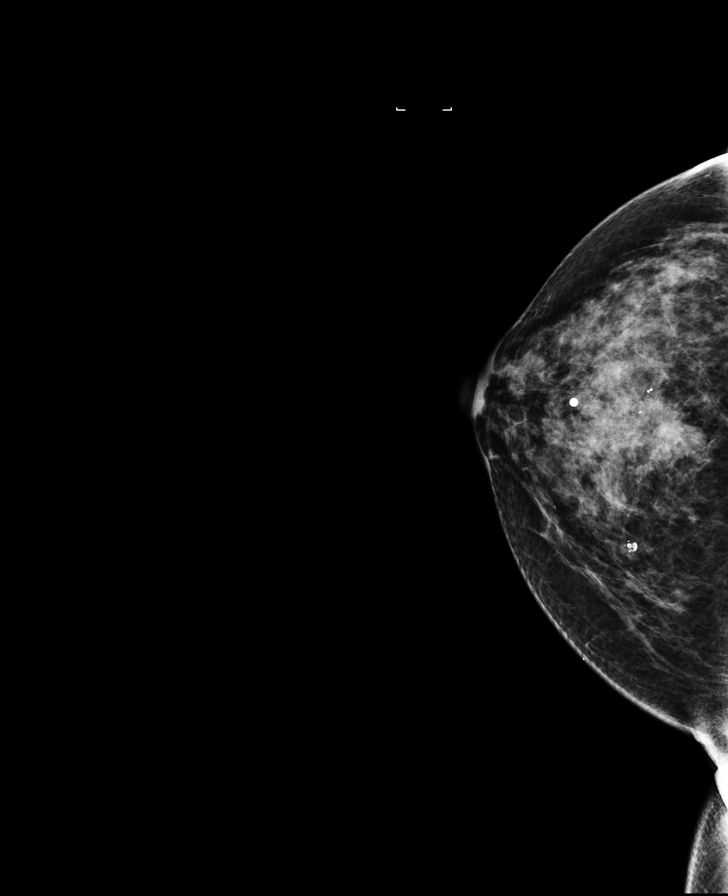

[R MLO]
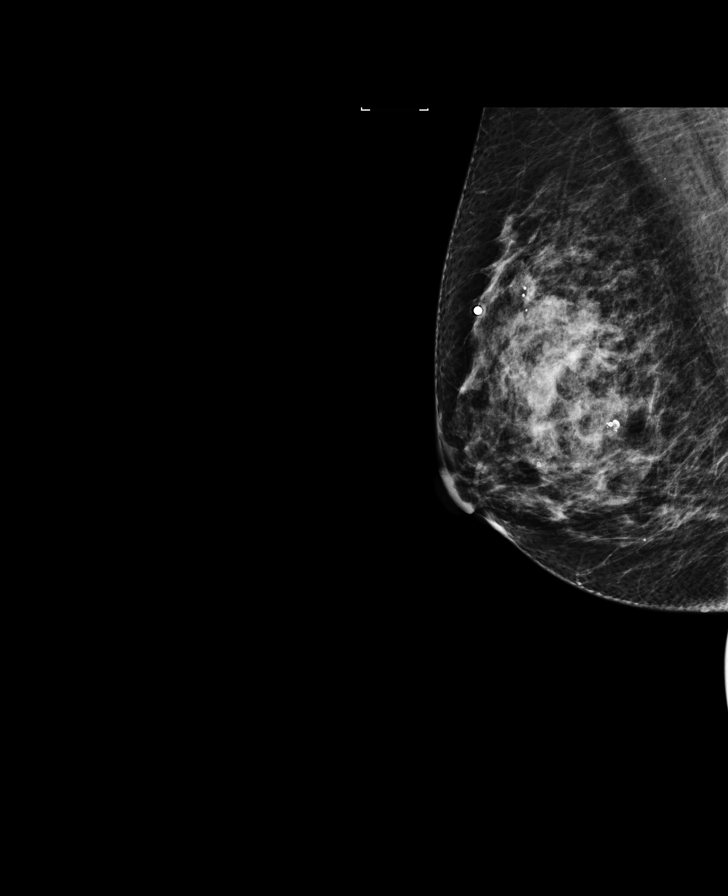

[L MLO synth-2D]
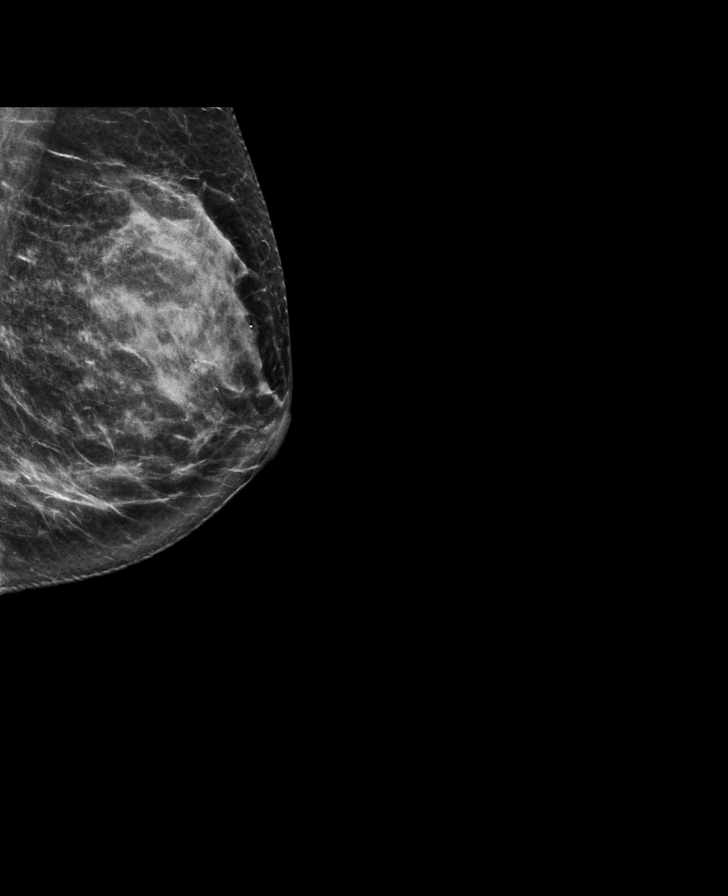

[8 of 35 positions shown; findings below may reference images not displayed]

ACR Breast Density Category c: The breast tissue is heterogeneously
dense, which may obscure small masses.
FINDINGS: Mammographically, there are no suspicious masses, areas of
architectural distortion or microcalcifications in the left breast.

There is an ill-defined mostly obscured irregular mass in the right
12 o'clock breast, middle to posterior depth. Faint associated
calcifications also seen. This finding corresponds to the area of
palpable concern.

Mammographic images were processed with CAD.

On physical exam, I could palpate an irregular mass in the right
superior breast which by palpation measures approximately 3.5 cm.

Targeted ultrasound is performed, showing right breast 11:30 o'clock
4 cm from the nipple hypoechoic irregular mass which measures 2.0 x
1.9 x 3.0 cm.

No evidence of right axillary lymphadenopathy.
IMPRESSION: Suspicious palpable right breast 11:30 o'clock mass, for which
ultrasound-guided core needle biopsy is recommended.

No evidence of right axillary lymphadenopathy.

RECOMMENDATION:
Ultrasound-guided core needle biopsy of the right breast mass.

I have discussed the findings and recommendations with the patient.
Results were also provided in writing at the conclusion of the
visit. If applicable, a reminder letter will be sent to the patient
regarding the next appointment.

BI-RADS CATEGORY  4: Suspicious.

## 2018-11-02 ENCOUNTER — Other Ambulatory Visit: Payer: Self-pay

## 2018-11-06 ENCOUNTER — Other Ambulatory Visit: Payer: Self-pay

## 2018-11-06 ENCOUNTER — Ambulatory Visit: Payer: BC Managed Care – PPO | Admitting: Obstetrics and Gynecology

## 2018-11-06 ENCOUNTER — Encounter: Payer: Self-pay | Admitting: Obstetrics and Gynecology

## 2018-11-06 VITALS — BP 138/78 | HR 84 | Temp 97.1°F | Ht 63.39 in | Wt 143.6 lb

## 2018-11-06 DIAGNOSIS — C50411 Malignant neoplasm of upper-outer quadrant of right female breast: Secondary | ICD-10-CM

## 2018-11-06 DIAGNOSIS — F419 Anxiety disorder, unspecified: Secondary | ICD-10-CM

## 2018-11-06 DIAGNOSIS — R6882 Decreased libido: Secondary | ICD-10-CM

## 2018-11-06 DIAGNOSIS — Z0000 Encounter for general adult medical examination without abnormal findings: Secondary | ICD-10-CM

## 2018-11-06 DIAGNOSIS — Z01419 Encounter for gynecological examination (general) (routine) without abnormal findings: Secondary | ICD-10-CM | POA: Diagnosis not present

## 2018-11-06 DIAGNOSIS — E038 Other specified hypothyroidism: Secondary | ICD-10-CM

## 2018-11-06 DIAGNOSIS — Z170 Estrogen receptor positive status [ER+]: Secondary | ICD-10-CM

## 2018-11-06 DIAGNOSIS — D508 Other iron deficiency anemias: Secondary | ICD-10-CM

## 2018-11-06 DIAGNOSIS — Z8739 Personal history of other diseases of the musculoskeletal system and connective tissue: Secondary | ICD-10-CM

## 2018-11-06 NOTE — Patient Instructions (Signed)

## 2018-11-06 NOTE — Progress Notes (Signed)
58 y.o. G0P0000 Single White or Caucasian Not Hispanic or Latino female here for annual exam.   H/O breast cancer. S/P lumpectomy, radiation and chemotherapy. On Letrozole.  No vaginal bleeding. She is in a long term relationship, living together x 19 years.  She has no libido, not since her cancer treatment in 2018 (was so sick). No h/o dyspareunia.  She has a lot of anxiety, always has. The cancer made it worse. Declines counseling. She was previously on medication for anxiety, hated taking a pill every day.  She stopped her synthroid.     No LMP recorded (lmp unknown). Patient is postmenopausal.          Sexually active: Yes.    The current method of family planning is post menopausal status.    Exercising: Yes.    Walking Smoker:  no  Health Maintenance: Pap:  03/30/2017 WNL, 02/27/2015 WNL History of abnormal Pap:  no MMG:  03/07/18 negative mammogram 04/12/2016 Birads 6 known biopsy proven malignancy BMD:   Never Colonoscopy: 2018 polyps, repeat in 5 years TDaP:  UTD per patient, 09/06/18 osteopenia, FRAX 12.4/0.4% Gardasil: Never   reports that she has never smoked. She has never used smokeless tobacco. She reports that she does not drink alcohol or use drugs. She works in Therapist, art.   Past Medical History:  Diagnosis Date  . Anxiety   . Breast mass 03/19/2016   right breast mass 1200  . Depression   . Hypothyroidism   . Malignant neoplasm of upper-outer quadrant of right breast in female, estrogen receptor positive ([place]) 03/24/2016    Past Surgical History:  Procedure Laterality Date  . BREAST SURGERY     lumpectomy  . TONSILLECTOMY      Current Outpatient Medications  Medication Sig Dispense Refill  . letrozole (FEMARA) 2.5 MG tablet Take 1 tablet by mouth daily.    Marland Kitchen ALPRAZolam (XANAX) 0.5 MG tablet Take 1 tablet (0.5 mg total) by mouth 3 (three) times daily as needed for anxiety. (Patient not taking: Reported on 11/06/2018) 90 tablet 3  . Levothyroxine Sodium  (SYNTHROID PO) Take 1 tablet by mouth daily.     No current facility-administered medications for this visit.     Family History  Problem Relation Age of Onset  . Colon cancer Mother   . Heart attack Father   . Heart disease Father   . Lung cancer Maternal Grandfather   Mom in her mid 24's with her colon cancer.  Review of Systems  Constitutional: Negative.   HENT: Negative.   Eyes: Negative.   Respiratory: Negative.   Cardiovascular: Negative.   Gastrointestinal: Negative.   Endocrine: Negative.   Genitourinary: Negative.   Musculoskeletal: Negative.   Skin: Negative.   Allergic/Immunologic: Negative.   Neurological: Negative.   Hematological: Negative.   Psychiatric/Behavioral: Negative.     Exam:   BP 138/78 (BP Location: Right Arm, Patient Position: Sitting, Cuff Size: Normal)   Pulse 84   Temp (!) 97.1 F (36.2 C) (Temporal)   Ht 5' 3.39" (1.61 m)   Wt 143 lb 9.6 oz (65.1 kg)   LMP  (LMP Unknown)   BMI 25.13 kg/m   Weight change: @WEIGHTCHANGE @ Height:   Height: 5' 3.39" (161 cm)  Ht Readings from Last 3 Encounters:  11/06/18 5' 3.39" (1.61 m)  03/31/16 5' 2.5" (1.588 m)    General appearance: alert, cooperative and appears stated age Head: Normocephalic, without obvious abnormality, atraumatic Neck: no adenopathy, supple, symmetrical, trachea midline and  thyroid normal to inspection and palpation Lungs: clear to auscultation bilaterally Cardiovascular: regular rate and rhythm Breasts: normal appearance, no masses or tenderness, evidence of right breast lumpectomy and SLN biopsy Abdomen: soft, non-tender; non distended,  no masses,  no organomegaly Extremities: extremities normal, atraumatic, no cyanosis or edema Skin: Skin color, texture, turgor normal. No rashes or lesions Lymph nodes: Cervical, supraclavicular, and axillary nodes normal. No abnormal inguinal nodes palpated Neurologic: Grossly normal   Pelvic: External genitalia:  no lesions               Urethra:  normal appearing urethra with no masses, tenderness or lesions              Bartholins and Skenes: normal                 Vagina: atrophic appearing vagina with normal color and discharge, no lesions              Cervix: no lesions               Bimanual Exam:  Uterus:  normal size, contour, position, consistency, mobility, non-tender              Adnexa: no mass, fullness, tenderness               Rectovaginal: Confirms               Anus:  normal sphincter tone, no lesions  Chaperone was present for exam.  A:  Well Woman with normal exam  H/O right breast cancer, on letrozole  H/O iron def anemia  Hypothyroid, not taking her medication  Low libido  P:   No pap this year  Mammogram in 2/21  Dexa and colonoscopy UTD  CBC, Ferritin, TFT's, Lipids  Discussed breast self exam  Discussed calcium and vit D intake  Information on Libido discussed  She is establishing care with a new primary

## 2018-11-07 LAB — LIPID PANEL
Chol/HDL Ratio: 3.1 ratio (ref 0.0–4.4)
Cholesterol, Total: 200 mg/dL — ABNORMAL HIGH (ref 100–199)
HDL: 64 mg/dL (ref >39)
LDL Chol Calc (NIH): 121 mg/dL — ABNORMAL HIGH (ref 0–99)
Triglycerides: 82 mg/dL (ref 0–149)
VLDL Cholesterol Cal: 15 mg/dL (ref 5–40)

## 2018-11-07 LAB — THYROID PANEL WITH TSH
Free Thyroxine Index: 1.6 (ref 1.2–4.9)
T3 Uptake Ratio: 24 % (ref 24–39)
T4, Total: 6.6 ug/dL (ref 4.5–12.0)
TSH: 7.89 u[IU]/mL — ABNORMAL HIGH (ref 0.450–4.500)

## 2018-11-07 LAB — CBC
Hematocrit: 35.7 % (ref 34.0–46.6)
Hemoglobin: 11.4 g/dL (ref 11.1–15.9)
MCH: 26.8 pg (ref 26.6–33.0)
MCHC: 31.9 g/dL (ref 31.5–35.7)
MCV: 84 fL (ref 79–97)
Platelets: 291 10*3/uL (ref 150–450)
RBC: 4.25 x10E6/uL (ref 3.77–5.28)
RDW: 13.3 % (ref 11.7–15.4)
WBC: 6.7 10*3/uL (ref 3.4–10.8)

## 2018-11-07 LAB — VITAMIN D 25 HYDROXY (VIT D DEFICIENCY, FRACTURES): Vit D, 25-Hydroxy: 31.6 ng/mL (ref 30.0–100.0)

## 2018-11-07 LAB — FERRITIN: Ferritin: 219 ng/mL — ABNORMAL HIGH (ref 15–150)

## 2018-11-11 ENCOUNTER — Other Ambulatory Visit: Payer: Self-pay | Admitting: Hematology and Oncology

## 2019-05-09 ENCOUNTER — Other Ambulatory Visit: Payer: Self-pay | Admitting: *Deleted

## 2019-05-09 ENCOUNTER — Other Ambulatory Visit: Payer: BC Managed Care – PPO

## 2019-05-09 DIAGNOSIS — E038 Other specified hypothyroidism: Secondary | ICD-10-CM

## 2021-04-23 DIAGNOSIS — Z9221 Personal history of antineoplastic chemotherapy: Secondary | ICD-10-CM | POA: Diagnosis not present

## 2021-04-23 DIAGNOSIS — Z08 Encounter for follow-up examination after completed treatment for malignant neoplasm: Secondary | ICD-10-CM | POA: Diagnosis not present

## 2021-04-23 DIAGNOSIS — D509 Iron deficiency anemia, unspecified: Secondary | ICD-10-CM | POA: Diagnosis not present

## 2021-04-23 DIAGNOSIS — D649 Anemia, unspecified: Secondary | ICD-10-CM | POA: Diagnosis not present

## 2021-04-23 DIAGNOSIS — C50911 Malignant neoplasm of unspecified site of right female breast: Secondary | ICD-10-CM | POA: Diagnosis not present

## 2021-04-23 DIAGNOSIS — Z170 Estrogen receptor positive status [ER+]: Secondary | ICD-10-CM | POA: Diagnosis not present

## 2021-04-23 DIAGNOSIS — M79621 Pain in right upper arm: Secondary | ICD-10-CM | POA: Diagnosis not present

## 2021-04-23 DIAGNOSIS — C50411 Malignant neoplasm of upper-outer quadrant of right female breast: Secondary | ICD-10-CM | POA: Diagnosis not present

## 2021-04-23 DIAGNOSIS — Z79811 Long term (current) use of aromatase inhibitors: Secondary | ICD-10-CM | POA: Diagnosis not present

## 2021-04-28 DIAGNOSIS — R928 Other abnormal and inconclusive findings on diagnostic imaging of breast: Secondary | ICD-10-CM | POA: Diagnosis not present

## 2021-04-28 DIAGNOSIS — Z853 Personal history of malignant neoplasm of breast: Secondary | ICD-10-CM | POA: Diagnosis not present

## 2021-10-27 DIAGNOSIS — Z79811 Long term (current) use of aromatase inhibitors: Secondary | ICD-10-CM | POA: Diagnosis not present

## 2021-10-27 DIAGNOSIS — Z853 Personal history of malignant neoplasm of breast: Secondary | ICD-10-CM | POA: Diagnosis not present

## 2021-10-27 DIAGNOSIS — F419 Anxiety disorder, unspecified: Secondary | ICD-10-CM | POA: Diagnosis not present

## 2021-10-27 DIAGNOSIS — Z08 Encounter for follow-up examination after completed treatment for malignant neoplasm: Secondary | ICD-10-CM | POA: Diagnosis not present

## 2021-12-17 DIAGNOSIS — F419 Anxiety disorder, unspecified: Secondary | ICD-10-CM | POA: Diagnosis not present

## 2021-12-17 DIAGNOSIS — Z8639 Personal history of other endocrine, nutritional and metabolic disease: Secondary | ICD-10-CM | POA: Diagnosis not present

## 2022-04-10 DIAGNOSIS — W5501XA Bitten by cat, initial encounter: Secondary | ICD-10-CM | POA: Diagnosis not present

## 2022-04-10 DIAGNOSIS — S61431A Puncture wound without foreign body of right hand, initial encounter: Secondary | ICD-10-CM | POA: Diagnosis not present

## 2022-04-10 DIAGNOSIS — L089 Local infection of the skin and subcutaneous tissue, unspecified: Secondary | ICD-10-CM | POA: Diagnosis not present

## 2022-04-29 DIAGNOSIS — C50911 Malignant neoplasm of unspecified site of right female breast: Secondary | ICD-10-CM | POA: Diagnosis not present

## 2022-04-29 DIAGNOSIS — D509 Iron deficiency anemia, unspecified: Secondary | ICD-10-CM | POA: Diagnosis not present

## 2022-04-29 DIAGNOSIS — C50411 Malignant neoplasm of upper-outer quadrant of right female breast: Secondary | ICD-10-CM | POA: Diagnosis not present

## 2022-04-29 DIAGNOSIS — Z853 Personal history of malignant neoplasm of breast: Secondary | ICD-10-CM | POA: Diagnosis not present

## 2022-04-29 DIAGNOSIS — Z08 Encounter for follow-up examination after completed treatment for malignant neoplasm: Secondary | ICD-10-CM | POA: Diagnosis not present

## 2022-04-29 DIAGNOSIS — Z9189 Other specified personal risk factors, not elsewhere classified: Secondary | ICD-10-CM | POA: Diagnosis not present

## 2022-04-29 DIAGNOSIS — Z9221 Personal history of antineoplastic chemotherapy: Secondary | ICD-10-CM | POA: Diagnosis not present

## 2022-04-29 DIAGNOSIS — Z923 Personal history of irradiation: Secondary | ICD-10-CM | POA: Diagnosis not present

## 2022-04-29 DIAGNOSIS — Z79811 Long term (current) use of aromatase inhibitors: Secondary | ICD-10-CM | POA: Diagnosis not present

## 2022-04-29 DIAGNOSIS — M85852 Other specified disorders of bone density and structure, left thigh: Secondary | ICD-10-CM | POA: Diagnosis not present

## 2022-04-29 DIAGNOSIS — R928 Other abnormal and inconclusive findings on diagnostic imaging of breast: Secondary | ICD-10-CM | POA: Diagnosis not present

## 2022-12-22 NOTE — Telephone Encounter (Signed)
Telephone call  

## 2024-02-10 ENCOUNTER — Other Ambulatory Visit: Payer: Self-pay | Admitting: Student

## 2024-02-10 DIAGNOSIS — R590 Localized enlarged lymph nodes: Secondary | ICD-10-CM

## 2024-02-13 ENCOUNTER — Ambulatory Visit
Admission: RE | Admit: 2024-02-13 | Discharge: 2024-02-13 | Disposition: A | Source: Ambulatory Visit | Attending: Student

## 2024-02-13 DIAGNOSIS — R590 Localized enlarged lymph nodes: Secondary | ICD-10-CM

## 2024-02-16 ENCOUNTER — Other Ambulatory Visit
